# Patient Record
Sex: Female | Born: 1965
Health system: Southern US, Community
[De-identification: ages and names within clinical notes are randomized; demographics above are authoritative.]

## PROBLEM LIST (undated history)

## (undated) DIAGNOSIS — G4730 Sleep apnea, unspecified: Secondary | ICD-10-CM

## (undated) DIAGNOSIS — I10 Essential (primary) hypertension: Secondary | ICD-10-CM

## (undated) DIAGNOSIS — G4733 Obstructive sleep apnea (adult) (pediatric): Secondary | ICD-10-CM

## (undated) HISTORY — PX: ABDOMINAL HYSTERECTOMY: SHX81

## (undated) HISTORY — DX: Essential (primary) hypertension: I10

---

## 1999-04-20 ENCOUNTER — Other Ambulatory Visit: Admission: RE | Admit: 1999-04-20 | Discharge: 1999-04-20 | Payer: Self-pay | Admitting: *Deleted

## 1999-05-04 ENCOUNTER — Encounter: Payer: Self-pay | Admitting: Cardiology

## 1999-05-04 ENCOUNTER — Ambulatory Visit (HOSPITAL_COMMUNITY): Admission: RE | Admit: 1999-05-04 | Discharge: 1999-05-04 | Payer: Self-pay | Admitting: Cardiology

## 1999-05-12 ENCOUNTER — Encounter: Payer: Self-pay | Admitting: Cardiology

## 1999-05-12 ENCOUNTER — Encounter: Admission: RE | Admit: 1999-05-12 | Discharge: 1999-05-12 | Payer: Self-pay | Admitting: Cardiology

## 1999-06-02 ENCOUNTER — Encounter (INDEPENDENT_AMBULATORY_CARE_PROVIDER_SITE_OTHER): Payer: Self-pay | Admitting: *Deleted

## 1999-06-02 ENCOUNTER — Encounter (HOSPITAL_BASED_OUTPATIENT_CLINIC_OR_DEPARTMENT_OTHER): Payer: Self-pay | Admitting: General Surgery

## 1999-06-02 ENCOUNTER — Ambulatory Visit (HOSPITAL_BASED_OUTPATIENT_CLINIC_OR_DEPARTMENT_OTHER): Admission: RE | Admit: 1999-06-02 | Discharge: 1999-06-02 | Payer: Self-pay | Admitting: General Surgery

## 2000-04-03 ENCOUNTER — Other Ambulatory Visit: Admission: RE | Admit: 2000-04-03 | Discharge: 2000-04-03 | Payer: Self-pay | Admitting: *Deleted

## 2001-10-17 ENCOUNTER — Ambulatory Visit (HOSPITAL_COMMUNITY): Admission: RE | Admit: 2001-10-17 | Discharge: 2001-10-17 | Payer: Self-pay | Admitting: Internal Medicine

## 2001-10-17 ENCOUNTER — Encounter: Payer: Self-pay | Admitting: Internal Medicine

## 2001-10-26 ENCOUNTER — Encounter: Payer: Self-pay | Admitting: Internal Medicine

## 2001-10-26 ENCOUNTER — Ambulatory Visit (HOSPITAL_COMMUNITY): Admission: RE | Admit: 2001-10-26 | Discharge: 2001-10-26 | Payer: Self-pay | Admitting: Internal Medicine

## 2002-12-14 ENCOUNTER — Emergency Department (HOSPITAL_COMMUNITY): Admission: EM | Admit: 2002-12-14 | Discharge: 2002-12-14 | Payer: Self-pay | Admitting: Emergency Medicine

## 2002-12-26 ENCOUNTER — Emergency Department (HOSPITAL_COMMUNITY): Admission: EM | Admit: 2002-12-26 | Discharge: 2002-12-26 | Payer: Self-pay | Admitting: Emergency Medicine

## 2003-07-04 ENCOUNTER — Encounter: Admission: RE | Admit: 2003-07-04 | Discharge: 2003-07-04 | Payer: Self-pay | Admitting: Cardiology

## 2003-09-10 ENCOUNTER — Other Ambulatory Visit: Admission: RE | Admit: 2003-09-10 | Discharge: 2003-09-10 | Payer: Self-pay | Admitting: Obstetrics and Gynecology

## 2004-03-24 ENCOUNTER — Ambulatory Visit (HOSPITAL_COMMUNITY): Admission: RE | Admit: 2004-03-24 | Discharge: 2004-03-24 | Payer: Self-pay | Admitting: Obstetrics and Gynecology

## 2005-01-17 ENCOUNTER — Other Ambulatory Visit: Admission: RE | Admit: 2005-01-17 | Discharge: 2005-01-17 | Payer: Self-pay | Admitting: Obstetrics and Gynecology

## 2005-03-25 ENCOUNTER — Ambulatory Visit (HOSPITAL_COMMUNITY): Admission: RE | Admit: 2005-03-25 | Discharge: 2005-03-25 | Payer: Self-pay | Admitting: Cardiology

## 2005-03-29 ENCOUNTER — Encounter (INDEPENDENT_AMBULATORY_CARE_PROVIDER_SITE_OTHER): Payer: Self-pay | Admitting: *Deleted

## 2005-03-29 ENCOUNTER — Inpatient Hospital Stay (HOSPITAL_COMMUNITY): Admission: RE | Admit: 2005-03-29 | Discharge: 2005-03-31 | Payer: Self-pay | Admitting: Obstetrics and Gynecology

## 2005-06-08 ENCOUNTER — Emergency Department (HOSPITAL_COMMUNITY): Admission: EM | Admit: 2005-06-08 | Discharge: 2005-06-08 | Payer: Self-pay | Admitting: Family Medicine

## 2006-03-31 ENCOUNTER — Ambulatory Visit (HOSPITAL_COMMUNITY): Admission: RE | Admit: 2006-03-31 | Discharge: 2006-03-31 | Payer: Self-pay | Admitting: Cardiology

## 2006-04-08 ENCOUNTER — Emergency Department (HOSPITAL_COMMUNITY): Admission: EM | Admit: 2006-04-08 | Discharge: 2006-04-08 | Payer: Self-pay | Admitting: Emergency Medicine

## 2006-06-09 ENCOUNTER — Encounter: Admission: RE | Admit: 2006-06-09 | Discharge: 2006-06-09 | Payer: Self-pay | Admitting: Cardiology

## 2006-06-16 ENCOUNTER — Encounter: Admission: RE | Admit: 2006-06-16 | Discharge: 2006-06-16 | Payer: Self-pay | Admitting: Cardiology

## 2007-04-06 ENCOUNTER — Ambulatory Visit (HOSPITAL_COMMUNITY): Admission: RE | Admit: 2007-04-06 | Discharge: 2007-04-06 | Payer: Self-pay | Admitting: Obstetrics and Gynecology

## 2008-04-08 ENCOUNTER — Ambulatory Visit (HOSPITAL_COMMUNITY): Admission: RE | Admit: 2008-04-08 | Discharge: 2008-04-08 | Payer: Self-pay | Admitting: Obstetrics and Gynecology

## 2009-04-10 ENCOUNTER — Ambulatory Visit (HOSPITAL_COMMUNITY): Admission: RE | Admit: 2009-04-10 | Discharge: 2009-04-10 | Payer: Self-pay | Admitting: Obstetrics and Gynecology

## 2010-02-21 ENCOUNTER — Encounter: Payer: Self-pay | Admitting: Cardiology

## 2010-06-18 NOTE — Op Note (Signed)
Makayla Aguilar, Makayla Aguilar               ACCOUNT NO.:  [id number]   MEDICAL RECORD NO.:  [id number]          PATIENT TYPE:  INP   LOCATION:  9317                          FACILITY:  [facility]   PHYSICIAN:  Hal Morales, M.D.DATE OF BIRTH:  05/17/1965   DATE OF PROCEDURE:  03/29/2005  DATE OF DISCHARGE:                                 OPERATIVE REPORT   PREOPERATIVE DIAGNOSIS:  Symptomatic uterine fibroids, menorrhagia,  intermenstrual bleeding and dysmenorrhea.   POSTOPERATIVE DIAGNOSES:  1.  Symptomatic uterine fibroids, menorrhagia, intermenstrual bleeding and      dysmenorrhea.  2.  Pelvic adhesions.  3.  Probable endometriosis.  4.  Left ovarian hemorrhagic cyst.   PROCEDURE:  Total abdominal hysterectomy, bilateral salpingo-oophorectomy  and lysis of adhesions, Jackson-Pratt drain placement.   SURGEON:  Hal Morales, M.D.   FIRST ASSISTANT:  Elmira J. Powell, P.A.-C.   ANESTHESIA:  General orotracheal.   ESTIMATED BLOOD LOSS:  350 mL.   COMPLICATIONS:  None.   FINDINGS:  The uterus was enlarged to approximately 16-weeks' size with  multiple uterine fibroids. The weight of the uterus and cervix was 758  grams.  There were powder burn lesions on the left anterior fundus of the  uterus consistent with endometriosis.  The right tube and ovary appeared  within normal limits.  The left tube appeared within normal limits.  The  left ovary was enlarged by a 5 cm hemorrhagic cyst.   DESCRIPTION OF PROCEDURE:  The patient was taken to the operating room after  appropriate identification and placed on the operating table.  After the  attainment of adequate general anesthesia, the abdomen, perineum and vagina  were prepped with multiple layers of Betadine and a Foley catheter inserted  into the bladder and then connected to straight drainage.  The abdomen was  draped in a sterile field.  The suprapubic region was infiltrated with 18 cc  of 0.25% Marcaine and a suprapubic  incision made.  This was extended a bit  further to allow for adequate visualization.  The abdomen was opened in  layers and the muscle slightly relaxed to allow for adequate visualization.  The upper abdomen was evaluated and no further lesions noted.  The self-  retaining O'Connor-O'Sullivan retractor was then placed and the bladder  blade placed.  The uterus was elevated into the operative field with two  Kelly clamps.  The right round ligament was then suture ligated and incised  and that incision taken anteriorly on the anterior leaf of the broad  ligament.  The utero-ovarian ligament was then clamped, cut, suture ligated  and tied.  A similar procedure was carried out on the opposite side.  The  bladder was bluntly dissected off the anterior cervix.  The right uterine  artery was skeletonized, clamped, cut and suture ligated. The left artery  was likewise treated.  The uterine fundus was then excised from the cervix  and removed from the operative field.  The paracervical tissues were then  clamped, cut, cut and suture ligated.  The uterosacral ligaments on the  right and left side were clamped,  cut, suture ligated and those sutures  held.  The vaginal angles were then clamped, cut and suture ligated allowing  the cervix to be removed from the upper vagina and removed from the  operative field.  The vaginal cuff was further closed with figure-of-eight  suture of 0 Vicryl.  Copious irrigation was carried out and hemostasis noted  to be adequate.  The sutures holding the vaginal angles and uterosacral  ligaments were tied together.  The left tube and ovary were then elevated  into the operative field and the infundibulopelvic ligament skeletonized.  The ureter was identified and the infundibulopelvic ligament clamped, cut,  tied with a free tie and then suture ligated.  A similar procedure was  carried out on the opposite side and the right and left tubes and ovaries  were removed from  the operative field.  Copious irrigation was carried out  and hemostasis noted to be adequate. All instruments were removed from the  peritoneal cavity.  The abdominal peritoneum was closed with running suture  of 2-0 Vicryl.  The rectus fascia was closed with running sutures of 0  Vicryl from the apex to the midline and tied in the midline.  The  subcutaneous tissue was made hemostatic with the Bovie cautery.  A  subcutaneous Jackson-Pratt drain was then placed through a stab wound in the  left lower quadrant and tied in with a suture of 0 silk.  The skin incision  was closed with skin staples. The grenade was attached to the JP drain, and  a sterile dressing applied to the incision.  The patient was then awakened  from general anesthesia and taken to the recovery room in satisfactory  condition having tolerated the procedure well with sponge and instrument  counts correct.   SPECIMENS TO PATHOLOGY:  Uterus and cervix, bilateral tubes and ovaries.      Hal Morales, M.D.  Electronically Signed     VPH/MEDQ  D:  03/29/2005  T:  03/29/2005  Job:  [id number]

## 2010-06-18 NOTE — H&P (Signed)
[name], [name] NO.:  [id number]   MEDICAL RECORD NO.:  [id number]           PATIENT TYPE:   LOCATION:                                 FACILITY:   PHYSICIAN:  Hal Morales, M.D.DATE OF BIRTH:  February 23, 1965   DATE OF ADMISSION:  DATE OF DISCHARGE:                                HISTORY & PHYSICAL   HISTORY OF PRESENT ILLNESS:  [name] is a 45 year old single African-  American female para 1-0-1-1 who presents for a total abdominal hysterectomy  with bilateral salpingo-oophorectomy because of symptomatic uterine  fibroids. For the past 2 years the patient has experienced menorrhagia which  has been characterized by the use of a tampon plus a pad on a hourly basis  for her 5-day menstrual flow. In spite of this frequent change of  protection, the patient has on occasion soiled her clothes. Additionally,  the patient complains of severe dysmenorrhea which she rates as a 10/10 on a  10-point pain scale and is not relieved from various prescription-strength  nonsteroidal antiinflammatory medications. A pelvic ultrasound in January  2006 revealed a uterus measuring 19.5 x 8.8 x 9.9 cm in which there were  observed multiple fibroids; however, the largest of which was not able to be  measured due to shadowing. The remaining two measurable fibroids measured  3.8 x 2.8 cm and 3.4 x 2.5 cm. Ovaries appeared within normal limits;  however, were difficult to visualize. The patient had a TSH in September  2006 which was within normal limits and a negative HIV, gonorrhea, and  chlamydia culture. An endometrial biopsy performed December 2006 revealed a  benign secretory endometrium without hyperplasia or malignancy. The patient  was placed on Provera 40 mg daily for her menorrhagia which did curtail her  bleeding; however, she continued to experience prolonged spotting which  lasted approximately 14 days. Due to the lack of response the patient has  experienced from  medicinal preparations and the disruptive nature of her  symptoms, the patient has decided to proceed with definitive therapy in the  form of hysterectomy. Additionally, the patient has requested to also have  her ovaries removed.   PAST MEDICAL HISTORY:  OB history:  Gravida 2, para 1-0-1-1. The patient had  a spontaneous vaginal delivery in 1990.   GYN history:  Menarche 44 years old. Last menstrual period March 14, 2005. The patient uses abstinence as her method of contraception. She does  have a history of human papilloma virus and herpes simplex virus II. The  patient underwent cryosurgery for abnormal Pap smear in 1986. Remaining Pap  smears have been within normal limits, with her most recent being December  2006. The patient had a normal mammogram February 2006.   Medical history:  Positive for hypertension, anemia, and migraines.   Surgical history:  In 2001, right breast biopsy which was benign. Greater  than 20 years ago, a D&C. She denies any problems with anesthesia or history  of blood transfusion.   FAMILY HISTORY:  Positive for depression, diabetes, hypertension, cancer  (throat and stomach), cardiovascular disease, and stroke.  HABITS:  The patient does smoke one-half to three-quarters packs of  cigarettes per day. Denies any use of alcohol.   SOCIAL HISTORY:  The patient is single and she is currently unemployed.   CURRENT MEDICATIONS:  1.  Diovan HCT 80/12.5 mg daily.  2.  Multivitamin one tablet daily.  3.  Calcium 500 mg twice daily.   ALLERGIES:  The patient denies any drug allergies; however, states that  HYDROCODONE causes her to feel high.   REVIEW OF SYSTEMS:  The patient does wear reading glasses. She has had for  the past 8 days a productive cough with yellow sputum but denies any sore  throat, fever, chills, urinary tract symptoms, vaginitis symptoms, or flank  pain.   PHYSICAL EXAMINATION:  VITAL SIGNS:  Blood pressure 118/80, weight is  217  pounds, height 5 feet 5 inches tall, temperature 97.8 degrees Fahrenheit  orally.  NECK:  Supple. There are no masses, thyromegaly, or cervical adenopathy.  HEART:  Regular rate and rhythm. There is no murmur.  LUNGS:  Revealed rhonchi in the left lung field without wheezes or rales.  BACK:  No CVA tenderness.  ABDOMEN:  Bowel sounds are present. It is soft without tenderness, guarding,  rebound, or organomegaly.  EXTREMITIES:  Without clubbing, cyanosis, or edema.  PELVIC:  (From February 28, 2005) EG/BUS is within normal limits. The vagina  is rugous. Cervix is nontender without lesions. Uterus appears 14-week size  without tenderness. Adnexa without tenderness or masses.   IMPRESSION:  1.  Symptomatic uterine fibroids.  2.  Menorrhagia.  3.  Intermenstrual bleeding.  4.  Dysmenorrhea.  5.  Bronchitis.   DISPOSITION:  A discussion was held with the patient regarding the  indications for her procedure along with its risks which include but are not  limited to reaction to anesthesia, damage to adjacent organs, infection,  excessive bleeding, the fact that her pain may not be resolved by this  procedure, and that removal of her ovaries will cause an immediate state of  menopause. The patient has accepted these risks and has consented to proceed  with a total abdominal hysterectomy with bilateral salpingo-oophorectomy at  [facility] March 29, 2005, at 7:30 a.m. Additionally,  the patient was treated for her bronchitis with a Z-Pak which she was to  take as directed.      Elmira J. Adline Aguilar.      Hal Morales, M.D.  Electronically Signed    EJP/MEDQ  D:  03/23/2005  T:  03/23/2005  Job:  [id number]

## 2010-06-18 NOTE — Op Note (Signed)
[facility]  Patient:    TORIAN, THOENNES                        MRN: [id number] Proc. Date: 06/02/99 Adm. Date:  [id number] Disc. Date: [id number] Attending:  Fortino Sic                           Operative Report  PREOPERATIVE DIAGNOSIS:  Mass, right breast.  POSTOPERATIVE DIAGNOSIS:  Mass, right breast.  OPERATION PERFORMED:  Excision of right breast mass with needle localization.  SURGEON:  Marnee Spring. Wiliam Ke, M.D.  ASSISTANT:  None.  ANESTHESIA:  General by hospital.  DESCRIPTION OF PROCEDURE:  Under good general anesthesia, skin of the breast was prepped and draped in the usual manner.  An excision of skin along with the needle was made and a curvilinear incision was made medial and lateral.  This was deepened into subcutaneous tissue.  The needle was followed down until a mass was found;  this was a fairly clear-cut fibroadenoma, at least clinically.  It was removed along with a piece of normal breast tissue.  The wire was pulled out. Hemostasis was obtained with electrocautery current.  The wound was then closed with subcutaneous 3-0 Vicryl and subcuticular 4-0 Dexon.  Steri-Strips were applied.  Estimated blood loss minimal.  The patient received no blood and left the operating room in satisfactory condition, after sponge and needle counts were verified. DD:  06/02/99 TD:  06/04/99 Job: [id number]

## 2010-06-18 NOTE — Discharge Summary (Signed)
NAMEJAPJI, KOK               ACCOUNT NO.:  [id number]   MEDICAL RECORD NO.:  [id number]          PATIENT TYPE:  INP   LOCATION:  9317                          FACILITY:  [facility]   PHYSICIAN:  Hal Morales, M.D.DATE OF BIRTH:  [birth year]   DATE OF ADMISSION:  03/29/2005  DATE OF DISCHARGE:  03/31/2005                                 DISCHARGE SUMMARY   DISCHARGE DIAGNOSES:  1.  Symptomatic uterine fibroids.  2.  Menorrhagia.  3.  Intermenstrual bleeding.  4.  Adenomyosis.  5.  Dysmenorrhea.  6.  Endometrial polyp.  7.  Endometriosis.   OPERATION:  On the date of admission, the patient underwent a total  abdominal hysterectomy with bilateral salpingo-oophorectomy and lysis of  adhesions, along with placement of a JP drain, tolerating procedures well.  The patient was found to have a uterus which was enlarged to approximately  16 weeks' size with multiple fibroids.  The weight of the uterus and cervix  was 758 grams.  There were powder burn lesions on the left anterior fundus  of the uterus consistent with endometriosis.  The right tube and ovary  appeared within normal limits.  The left tube appeared within normal limits.  The left ovary was enlarged by a 5-cm hemorrhagic cyst.   HISTORY OF PRESENT ILLNESS:  Ms. Worden is a 45 year old, single, African-  American female, para 1-0-1-1 who presents for a total abdominal  hysterectomy with bilateral salpingo-oophorectomy because of symptomatic  uterine fibroids.  Please see the patient's dictated history and physical  examination for details.   PREOPERATIVE PHYSICAL EXAMINATION:  VITAL SIGNS:  Blood pressure 118/80,  weight 217 pounds, height 5 feet 5 inches tall, temperature 97.8 degrees  Fahrenheit orally.  GENERAL:  Within normal limits.  PELVIC:  EG/BUS was within normal limits.  Vagina was rugose.  Cervix was  nontender without lesions.  Uterus appeared 14 weeks' size without  tenderness.  Adnexa without tenderness or  masses.   HOSPITAL COURSE:  On the day of admission, the patient underwent  aforementioned procedures, tolerating them all well.  Postoperative course  was unremarkable with the patient tolerating a postop hemoglobin of 9.4  (preoperative hemoglobin 11.6).  By postop day #2, the patient had resumed  bowel and bladder function and was therefore deemed ready for discharge  home.   DISCHARGE MEDICATIONS:  1.  Iron 1 tablet twice daily for 6 weeks.  2.  Phenergan 25 mg every 6 hours as needed for nausea.  3.  Colace 100 milligram twice daily until bowel movements are regular.  4.  Ibuprofen 600 mg with food every 6 hours for 3 days, then as needed for      pain.  5.  Percocet 1-2 tablets every 4 hours as needed for pain.   FOLLOW UP:  1.  The patient is to call [facility] for an appointment on      April 05, 2005 to have her staples removed.  2.  She is otherwise scheduled to have her 6 weeks postoperative visit with      Dr. Pennie Rushing on May 17, 2005 at 11:30 a.m.   DISCHARGE INSTRUCTIONS:  1.  The patient was given a copy of [facility] postoperative      instruction sheet.  2.  She was further advised to avoid driving for 2 weeks, heavy lifting for      4 weeks, intercourse for 6 weeks, that she may walk up steps, may      shower, and was to increase her activity slowly.  3.  The patient's diet was without restriction.   FINAL PATHOLOGY:  Uterus, cervix, and right/left ovaries, and fallopian  tubes:  Cervix showed chronic cervicitis with squamous metaplasia.  No  intraepithelial lesion.  Endometrium was secretory with benign endometrial  polyp measuring 2-cm; the myometrium revealed adenomyosis with multiple  leiomyomas.  The patient's uterine serosa revealed endometriosis.  Ovaries  possessed hemorrhagic corpus luteum follicle cysts, surface adhesions, and  fallopian tubes were benign.      [name].      Hal Morales, M.D.   Electronically Signed    EJP/MEDQ  D:  04/21/2005  T:  04/22/2005  Job:  [id number]

## 2013-08-30 ENCOUNTER — Other Ambulatory Visit (HOSPITAL_COMMUNITY): Payer: Self-pay | Admitting: Internal Medicine

## 2013-08-30 DIAGNOSIS — Z1231 Encounter for screening mammogram for malignant neoplasm of breast: Secondary | ICD-10-CM

## 2013-09-13 ENCOUNTER — Ambulatory Visit (HOSPITAL_COMMUNITY)
Admission: RE | Admit: 2013-09-13 | Discharge: 2013-09-13 | Disposition: A | Discharge status: Home / Self Care | Payer: 59 | Source: Ambulatory Visit | Attending: Internal Medicine | Admitting: Internal Medicine

## 2013-09-13 DIAGNOSIS — Z1231 Encounter for screening mammogram for malignant neoplasm of breast: Secondary | ICD-10-CM | POA: Insufficient documentation

## 2014-09-01 LAB — HM COLONOSCOPY

## 2015-05-21 ENCOUNTER — Other Ambulatory Visit: Payer: Self-pay | Admitting: Internal Medicine

## 2015-05-21 DIAGNOSIS — N939 Abnormal uterine and vaginal bleeding, unspecified: Secondary | ICD-10-CM

## 2015-05-27 ENCOUNTER — Ambulatory Visit
Admission: RE | Admit: 2015-05-27 | Discharge: 2015-05-27 | Disposition: A | Discharge status: Home / Self Care | Payer: Medicaid Other | Source: Ambulatory Visit | Attending: Internal Medicine | Admitting: Internal Medicine

## 2015-05-27 DIAGNOSIS — N939 Abnormal uterine and vaginal bleeding, unspecified: Secondary | ICD-10-CM

## 2018-02-17 ENCOUNTER — Emergency Department (HOSPITAL_COMMUNITY): Payer: BLUE CROSS/BLUE SHIELD

## 2018-02-17 ENCOUNTER — Emergency Department (HOSPITAL_COMMUNITY)
Admission: EM | Admit: 2018-02-17 | Discharge: 2018-02-17 | Disposition: A | Discharge status: Home / Self Care | Payer: BLUE CROSS/BLUE SHIELD | Attending: Emergency Medicine | Admitting: Emergency Medicine

## 2018-02-17 ENCOUNTER — Encounter (HOSPITAL_COMMUNITY): Payer: Self-pay | Admitting: Emergency Medicine

## 2018-02-17 ENCOUNTER — Other Ambulatory Visit: Payer: Self-pay

## 2018-02-17 DIAGNOSIS — E079 Disorder of thyroid, unspecified: Secondary | ICD-10-CM | POA: Diagnosis not present

## 2018-02-17 DIAGNOSIS — F17210 Nicotine dependence, cigarettes, uncomplicated: Secondary | ICD-10-CM | POA: Insufficient documentation

## 2018-02-17 DIAGNOSIS — R0789 Other chest pain: Secondary | ICD-10-CM | POA: Diagnosis present

## 2018-02-17 DIAGNOSIS — R110 Nausea: Secondary | ICD-10-CM | POA: Insufficient documentation

## 2018-02-17 DIAGNOSIS — R030 Elevated blood-pressure reading, without diagnosis of hypertension: Secondary | ICD-10-CM | POA: Diagnosis not present

## 2018-02-17 LAB — BASIC METABOLIC PANEL
Anion gap: 10 (ref 5–15)
BUN: 13 mg/dL (ref 6–20)
CO2: 25 mmol/L (ref 22–32)
Calcium: 9.1 mg/dL (ref 8.9–10.3)
Chloride: 106 mmol/L (ref 98–111)
Creatinine, Ser: 0.94 mg/dL (ref 0.44–1.00)
GFR calc Af Amer: >60 mL/min (ref >60)
GFR calc non Af Amer: >60 mL/min (ref >60)
Glucose, Bld: 102 mg/dL — ABNORMAL HIGH (ref 70–99)
Potassium: 3.3 mmol/L — ABNORMAL LOW (ref 3.5–5.1)
Sodium: 141 mmol/L (ref 135–145)

## 2018-02-17 LAB — CBC
HCT: 39.4 % (ref 36.0–46.0)
Hemoglobin: 12.8 g/dL (ref 12.0–15.0)
MCH: 29.6 pg (ref 26.0–34.0)
MCHC: 32.5 g/dL (ref 30.0–36.0)
MCV: 91.2 fL (ref 80.0–100.0)
Platelets: 316 10*3/uL (ref 150–400)
RBC: 4.32 MIL/uL (ref 3.87–5.11)
RDW: 12.6 % (ref 11.5–15.5)
WBC: 8.3 10*3/uL (ref 4.0–10.5)
nRBC: 0 % (ref 0.0–0.2)

## 2018-02-17 LAB — D-DIMER, QUANTITATIVE: D-Dimer, Quant: 0.6 ug/mL-FEU — ABNORMAL HIGH (ref 0.00–0.50)

## 2018-02-17 LAB — I-STAT TROPONIN, ED: Troponin i, poc: 0.01 ng/mL (ref 0.00–0.08)

## 2018-02-17 LAB — TROPONIN I: Troponin I: <0.03 ng/mL (ref <0.03)

## 2018-02-17 MED ORDER — LORAZEPAM 2 MG/ML IJ SOLN
2.0000 mg | Freq: Once | INTRAMUSCULAR | Status: DC
  Filled 2018-02-17: qty 1

## 2018-02-17 MED ORDER — IOPAMIDOL (ISOVUE-370) INJECTION 76%
INTRAVENOUS | Status: AC
  Administered 2018-02-17: 75 mL
  Filled 2018-02-17: qty 100

## 2018-02-17 MED ORDER — MORPHINE SULFATE (PF) 4 MG/ML IV SOLN
4.0000 mg | Freq: Once | INTRAVENOUS | Status: AC
  Administered 2018-02-17: 4 mg via INTRAVENOUS
  Filled 2018-02-17: qty 1

## 2018-02-17 MED ORDER — METHOCARBAMOL 500 MG PO TABS: 500.0000 mg | ORAL_TABLET | Freq: Two times a day (BID) | ORAL | 0 refills | Status: DC

## 2018-02-17 MED ORDER — KETOROLAC TROMETHAMINE 15 MG/ML IJ SOLN
15.0000 mg | Freq: Once | INTRAMUSCULAR | Status: AC
  Administered 2018-02-17: 15 mg via INTRAMUSCULAR
  Filled 2018-02-17: qty 1

## 2018-02-17 MED ORDER — LORAZEPAM 2 MG/ML IJ SOLN: 0.5000 mg | Freq: Once | INTRAMUSCULAR | Status: DC

## 2018-02-17 MED ORDER — ONDANSETRON HCL 4 MG/2ML IJ SOLN
4.0000 mg | Freq: Once | INTRAMUSCULAR | Status: AC
  Administered 2018-02-17: 4 mg via INTRAVENOUS
  Filled 2018-02-17: qty 2

## 2018-02-17 NOTE — ED Triage Notes (Signed)
Patient c/o mid sternal chest pain x 3 days. Patient states for past two days pain has been intermittent, but pain became constant last night. No radiation. Mild shortness of breath with exertion.

## 2018-02-17 NOTE — ED Provider Notes (Signed)
[facility] EMERGENCY DEPARTMENT Provider Note   CSN: [id number] Arrival date & time: 02/17/18  2423     History   Chief Complaint Chief Complaint  Patient presents with  . Chest Pain    HPI Makayla Aguilar is a 53 y.o. female without history of chronic medical conditions or daily medication use presenting today for chest pain.  Patient states that she first noticed her chest pain 3 days ago while at work, right-sided "stuck feeling/pressure "that lasted for approximately 1 hour.  Pain was then intermittent for 2 days, coming and going at random times and always lasting for approximately 1 hour.  Patient states that her pain again started last night and has been constant since that time.  She describes her pain as a 10/10 in severity that is associated with nausea without vomiting.  Patient also endorses shortness of breath however only with exertion.  She states that her chest pain however does not get worse with exertion and that she does not have shortness of breath at rest.  Additionally patient reports that 3 days ago prior to onset of chest pain she was at work sitting in her chair. She states that she was feeling tired and does not remember falling asleep. 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  Patient does not believe this was true syncope and that she may have just fallen asleep. She has not had a recurrence since that time.  Patient states that she is an otherwise healthy 53 year old female without chronic medical conditions or daily medication use.  She denies any risk factors including diabetes, hypertension, hyperlipidemia, CAD, diabetes, smoking, family history of CAD.  Patient denies history of blood clot, recent immobilization, cough/hemoptysis, extremity swelling, history of malignancy.  HPI  History reviewed. No pertinent past medical history.  There are no active problems to display for this  patient.   Past Surgical History:  Procedure Laterality Date  . ABDOMINAL HYSTERECTOMY       OB History   No obstetric history on file.      Home Medications    Prior to Admission medications   Medication Sig Start Date End Date Taking? Authorizing Provider  methocarbamol (ROBAXIN) 500 MG tablet Take 1 tablet (500 mg total) by mouth 2 (two) times daily. 02/17/18   Bill Salinas, PA-C    Family History No family history on file.  Social History Social History   Tobacco Use  . Smoking status: Current Every Day Smoker    Packs/day: 0.50    Types: Cigarettes  . Smokeless tobacco: Never Used  Substance Use Topics  . Alcohol use: Not Currently  . Drug use: Not Currently     Allergies   Patient has no known allergies.   Review of Systems Review of Systems  Constitutional: Negative.  Negative for chills, diaphoresis and fever.  HENT: Negative for rhinorrhea.   Eyes: Negative.  Negative for visual disturbance.  Respiratory: Positive for shortness of breath (Only with exertion). Negative for cough.   Cardiovascular: Positive for chest pain.  Gastrointestinal: Positive for nausea. Negative for abdominal pain, diarrhea and vomiting.  Musculoskeletal: Negative.  Negative for arthralgias and myalgias.  Neurological: Positive for syncope (Questionable). Negative for dizziness, weakness and headaches.  All other systems reviewed and are negative.  Physical Exam Updated Vital Signs BP (!) 148/75   Pulse 66   Temp 98.5 F (36.9 C) (Oral)   Resp 12  Ht 5\' 6"  (1.676 m)   Wt 93.4 kg   SpO2 100%   BMI 33.25 kg/m   Physical Exam Constitutional:      General: She is not in acute distress.    Appearance: She is well-developed. She is not ill-appearing or diaphoretic.  HENT:     Head: Normocephalic and atraumatic.     Right Ear: External ear normal.     Left Ear: External ear normal.     Nose: Nose normal.  Eyes:     Pupils: Pupils are equal, round, and  reactive to light.  Neck:     Musculoskeletal: Normal range of motion and neck supple.     Trachea: Trachea normal. No tracheal deviation.  Cardiovascular:     Rate and Rhythm: Normal rate and regular rhythm.     Pulses:          Radial pulses are 2+ on the right side and 2+ on the left side.       Dorsalis pedis pulses are 2+ on the right side and 2+ on the left side.       Posterior tibial pulses are 2+ on the right side and 2+ on the left side.     Heart sounds: Normal heart sounds.  Pulmonary:     Effort: Pulmonary effort is normal. No respiratory distress.     Breath sounds: Normal breath sounds. No wheezing or rhonchi.  Chest:     Chest wall: No tenderness.  Abdominal:     Palpations: Abdomen is soft.     Tenderness: There is no abdominal tenderness. There is no guarding or rebound.  Musculoskeletal: Normal range of motion.     Right lower leg: She exhibits no tenderness. No edema.     Left lower leg: She exhibits no tenderness. No edema.  Feet:     Right foot:     Protective Sensation: 3 sites tested. 3 sites sensed.     Left foot:     Protective Sensation: 3 sites tested. 3 sites sensed.  Skin:    General: Skin is warm and dry.     Capillary Refill: Capillary refill takes less than 2 seconds.  Neurological:     General: No focal deficit present.     Mental Status: She is alert and oriented to person, place, and time.     GCS: GCS eye subscore is 4. GCS verbal subscore is 5. GCS motor subscore is 6.     Comments: Speech is clear and goal oriented, follows commands Major Cranial nerves without deficit, no facial droop Normal strength in upper and lower extremities bilaterally including dorsiflexion and plantar flexion, strong and equal grip strength Sensation normal to light touch Moves extremities without ataxia, coordination intact Normal gait  Psychiatric:        Mood and Affect: Mood normal.        Behavior: Behavior normal.    ED Treatments / Results   Labs (all labs ordered are listed, but only abnormal results are displayed) Labs Reviewed  BASIC METABOLIC PANEL - Abnormal; Notable for the following components:      Result Value   Potassium 3.3 (*)    Glucose, Bld 102 (*)    All other components within normal limits  D-DIMER, QUANTITATIVE (NOT AT [facility]) - Abnormal; Notable for the following components:   D-Dimer, Quant 0.60 (*)    All other components within normal limits  CBC  TROPONIN I  I-STAT TROPONIN, ED    EKG  EKG Interpretation  Date/Time:  Saturday February 17 2018 09:51:37 EST Ventricular Rate:  69 PR Interval:    QRS Duration: 96 QT Interval:  390 QTC Calculation: 418 R Axis:   49 Text Interpretation:  Sinus rhythm Abnormal R-wave progression, early transition Baseline wander in lead(s) II III aVF V3 V4 V5 No old tracing to compare Confirmed by Lorre NickAllen, Anthony ([id number]) on 02/17/2018 11:26:36 AM   Radiology Ct Angio Chest Pe W And/or Wo Contrast  Result Date: 02/17/2018 CLINICAL DATA:  Chest pain and positive D-dimer study EXAM: CT ANGIOGRAPHY CHEST WITH CONTRAST TECHNIQUE: Multidetector CT imaging of the chest was performed using the standard protocol during bolus administration of intravenous contrast. Multiplanar CT image reconstructions and MIPs were obtained to evaluate the vascular anatomy. CONTRAST:  75mL ISOVUE-370 IOPAMIDOL (ISOVUE-370) INJECTION 76% COMPARISON:  Chest radiograph February 17, 2018 FINDINGS: Cardiovascular: There is no demonstrable pulmonary embolus. There is no thoracic aortic aneurysm or dissection. Visualized great vessels appear normal. There is left ventricular hypertrophy. There is no pericardial effusion or pericardial thickening. Mediastinum/Nodes: There is a dominant mass in the right lobe of the thyroid measuring 2.8 x 1.5 cm. There are occasional subcentimeter mediastinal lymph nodes. There is no adenopathy by size criteria evident. No esophageal lesions are appreciable. Lungs/Pleura: There  is no edema or consolidation. No pleural effusion or pleural thickening. There is mild central peribronchial thickening on the left. Upper Abdomen: There is a 1.2 x 1.0 cm benign adenoma on the left. Visualized upper abdominal structures otherwise appear normal. Musculoskeletal: There are no blastic or lytic bone lesions. No chest wall lesions are evident. Review of the MIP images confirms the above findings. IMPRESSION: 1. No demonstrable pulmonary embolus. No thoracic aortic aneurysm or dissection. There is left ventricular hypertrophy. 2. Mild central bronchitis on the left. No lung edema or consolidation. 3.  No thoracic adenopathy. 4. **An incidental finding of potential clinical significance has been found. Dominant mass right lobe of the thyroid measuring 2.8 x 1.5 cm. Consider further evaluation with thyroid ultrasound nonemergently. If patient is clinically hyperthyroid, consider nuclear medicine thyroid uptake and scan.** 5.  Small benign left adrenal adenoma. Electronically Signed   By: Bretta BangWilliam  Woodruff III M.D.   On: 02/17/2018 12:42   Dg Chest Port 1 View  Result Date: 02/17/2018 CLINICAL DATA:  Chest pain, shortness of breath EXAM: PORTABLE CHEST 1 VIEW COMPARISON:  06/09/2006 FINDINGS: The heart size and mediastinal contours are within normal limits. Both lungs are clear. The visualized skeletal structures are unremarkable. IMPRESSION: No active disease. Electronically Signed   By: Elige KoHetal  Patel   On: 02/17/2018 10:09    Procedures Procedures (including critical care time)  Medications Ordered in ED Medications  LORazepam (ATIVAN) injection 0.5 mg (0.5 mg Intravenous Not Given 02/17/18 1429)  morphine 4 MG/ML injection 4 mg (4 mg Intravenous Given 02/17/18 1013)  ondansetron (ZOFRAN) injection 4 mg (4 mg Intravenous Given 02/17/18 1013)  iopamidol (ISOVUE-370) 76 % injection (75 mLs  Contrast Given 02/17/18 1221)  ketorolac (TORADOL) 15 MG/ML injection 15 mg (15 mg Intramuscular Given  02/17/18 1429)     Initial Impression / Assessment and Plan / ED Course  I have reviewed the triage vital signs and the nursing notes.  Pertinent labs & imaging results that were available during my care of the patient were reviewed by me and considered in my medical decision making (see chart for details).  Clinical Course as of Feb 17 1522  Sat Feb 17, 2018  1214  Discussed CT delay with radiology, patient is next in line to receive scan.   [BM]    Clinical Course User Index [BM] Elizabeth Palau   13:71 AM: 53 year old otherwise healthy female presents today for 3 days of intermittent chest pain which became continuous beginning last night.  Right-sided pressure like feeling.  Patient without shortness of breath at rest but states that she feels slightly short of breath with exertion.  Pain however is without exacerbating factors.  On arrival patient is well-appearing, no acute distress.  Patient noted to be hypertensive however all other vital signs within normal limits.  Pulse around 60 bpm with SPO2 of 100% on room air.  Pedal pulses intact and equal bilaterally.  Lab work, EKG, chest x-ray ordered. ---------------- EKG without acute findings reviewed by Dr. Freida Busman Initial troponin negative CBC within normal limits BMP nonacute Chest x-ray negative D-dimer pending Pain and nausea medication ordered --------------- Patient states provement of her symptoms. D-dimer is positive, CT angio ordered --------------- Patient reevaluated, resting comfortably no acute distress.  She is sleeping, easily arousable to voice.  States that she feels well at this time.  Vital signs stable. ------------- CT Angie of chest:  IMPRESSION:  1. No demonstrable pulmonary embolus. No thoracic aortic aneurysm or  dissection. There is left ventricular hypertrophy.    2. Mild central bronchitis on the left. No lung edema or  consolidation.    3. No thoracic adenopathy.    4. **An  incidental finding of potential clinical significance has  been found. Dominant mass right lobe of the thyroid measuring 2.8 x  1.5 cm. Consider further evaluation with thyroid ultrasound  nonemergently. If patient is clinically hyperthyroid, consider  nuclear medicine thyroid uptake and scan.**    5. Small benign left adrenal adenoma.  ---------------------------- Patient informed of incidental CT findings and need for follow-up. ------------ Delta Troponin negative ------------  The patient was noted to have elevated BP in ED today. I have spoken with the patient regarding elevated blood pressure readings and the need for improved management. I instructed the patient to followup with their PCP within 1 week for BP check. I also counseled the patient regarding the signs and symptoms which would require an emergent visit to an emergency department for hypertensive urgency and/or hypertensive emergency.  Discussed questionable syncope vs meerly falling asleep with Dr. Freida Busman, this has not recurred. Dr. Freida Busman agrees, do not suspect true syncope at this time. Patient informed to return if she has a syncopal episode in the future.  Patient is to be discharged with recommendation to follow up with PCP in regards to today's hospital visit. Chest pain is not likely of cardiac or pulmonary etiology d/t presentation, perc negative, VSS, no tracheal deviation, no JVD or new murmur, RRR, breath sounds equal bilaterally, EKG without acute abnormalities, negative troponin, and negative CXR. Pt has been advised to return to the ED is CP becomes exertional, associated with diaphoresis or nausea, radiates to left jaw/arm, worsens or becomes concerning in any way. Pt appears reliable for follow up and is agreeable to discharge.   Heart score less than 4: One point for age.  Patient concerned that pain may return in the future, return precautions discussed.  Suspect musculoskeletal etiology of resolved pain,  patient has been prescribed Robaxin. Patient informed not to drive or operate machinery if taking Robaxin.   At this time there does not appear to be any evidence of an acute emergency medical condition and the patient  appears stable for discharge with appropriate outpatient follow up. Diagnosis was discussed with patient who verbalizes understanding of care plan and is agreeable to discharge. I have discussed return precautions with patient who verbalizes understanding of return precautions. Patient strongly encouraged to follow-up with their PCP this week. All questions answered.  Case has been discussed with Dr. Freida Busman who agrees with the above plan to discharge  Note: Portions of this report may have been transcribed using voice recognition software. Every effort was made to ensure accuracy; however, inadvertent computerized transcription errors may still be present. Final Clinical Impressions(s) / ED Diagnoses   Final diagnoses:  Atypical chest pain  Thyroid mass  Elevated blood pressure reading    ED Discharge Orders         Ordered    methocarbamol (ROBAXIN) 500 MG tablet  2 times daily     02/17/18 1524           Elizabeth Palau 02/17/18 1559    Lorre Nick, MD 02/18/18 [phone]

## 2018-02-17 NOTE — Discharge Instructions (Addendum)
You have been diagnosed today with atypical chest pain as well as an incidental finding of a thyroid mass and elevated blood pressure.  At this time there does not appear to be the presence of an emergent medical condition, however there is always the potential for conditions to change. Please read and follow the below instructions.  Please return to the Emergency Department immediately for any new or worsening symptoms. Please be sure to follow up with your Primary Care Provider this week regarding your visit today; please call their office to schedule an appointment even if you are feeling better for a follow-up visit. Incidentally a mass in the right lobe of your thyroid approximately 2.8 x 1.5 cm was found on CT scan today.  Please discuss this with your primary care provider this week.  It is highly recommended that further evaluation of your thyroid mass is performed including ultrasound.  Please schedule this with your primary care provider this week. Additionally your blood pressure was elevated today.  Please discuss this with your primary care provider this week and go to their office for blood pressure recheck.  Additionally your CT scan shows left ventricular hypertrophy, discuss this with your primary care provider this week. You may use the muscle relaxer Robaxin as prescribed for musculoskeletal pain.  Do not drive or operate machinery will take this medication because it may make you drowsy.  Get help right away if: Your chest pain gets worse. You have a cough that gets worse, or you cough up blood. You have severe pain in your abdomen. You faint. You have sudden, unexplained chest discomfort. You have sudden, unexplained discomfort in your arms, back, neck, or jaw. You have shortness of breath at any time. You suddenly start to sweat, or your skin gets clammy. You feel nausea or you vomit. You suddenly feel lightheaded or dizzy. You have severe weakness, or unexplained weakness  or fatigue. Your heart begins to beat quickly, or it feels like it is skipping beats. You have fever Your pain returns Get help right away if: You get a very bad headache. You start to feel confused. You feel weak or numb. You feel faint. You get very bad pain in your: Chest. Belly (abdomen). You throw up (vomit) more than once. You have trouble breathing.  Please read the additional information packets attached to your discharge summary.  Do not take your medicine if  develop an itchy rash, swelling in your mouth or lips, or difficulty breathing.

## 2018-02-19 ENCOUNTER — Encounter: Payer: Self-pay | Admitting: Nurse Practitioner

## 2018-02-19 ENCOUNTER — Ambulatory Visit: Payer: BLUE CROSS/BLUE SHIELD | Admitting: Nurse Practitioner

## 2018-02-19 ENCOUNTER — Other Ambulatory Visit: Payer: Self-pay

## 2018-02-19 VITALS — BP 162/100 | HR 70 | Temp 98.3°F | Ht 66.8 in | Wt 212.2 lb

## 2018-02-19 DIAGNOSIS — R221 Localized swelling, mass and lump, neck: Secondary | ICD-10-CM | POA: Diagnosis not present

## 2018-02-19 DIAGNOSIS — J3089 Other allergic rhinitis: Secondary | ICD-10-CM

## 2018-02-19 DIAGNOSIS — B349 Viral infection, unspecified: Secondary | ICD-10-CM | POA: Diagnosis not present

## 2018-02-19 DIAGNOSIS — R030 Elevated blood-pressure reading, without diagnosis of hypertension: Secondary | ICD-10-CM

## 2018-02-19 DIAGNOSIS — R631 Polydipsia: Secondary | ICD-10-CM | POA: Diagnosis not present

## 2018-02-19 LAB — POCT URINALYSIS DIP (MANUAL ENTRY)
Bilirubin, UA: NEGATIVE
Glucose, UA: NEGATIVE mg/dL
Ketones, POC UA: NEGATIVE mg/dL
Leukocytes, UA: NEGATIVE
Nitrite, UA: NEGATIVE
Spec Grav, UA: 1.025 (ref 1.010–1.025)
Urobilinogen, UA: 0.2 E.U./dL
pH, UA: 6.5 (ref 5.0–8.0)

## 2018-02-19 MED ORDER — MOMETASONE FUROATE 50 MCG/ACT NA SUSP: 2.0000 | Freq: Every day | NASAL | 2 refills | Status: DC

## 2018-02-19 MED ORDER — AZITHROMYCIN 250 MG PO TABS: ORAL_TABLET | ORAL | 0 refills | Status: DC

## 2018-02-19 NOTE — Patient Instructions (Addendum)
   Can take over the counter Nasonex or Flonase if insurance is does not cover medication

## 2018-02-19 NOTE — Progress Notes (Signed)
  Subjective:     Patient ID: Makayla Aguilar , female    DOB: 10/25/1965 , 53 y.o.   MRN: [id number]   Chief Complaint  Patient presents with  . cant smell  . right ear pain    all started last week   . Polydipsia    HPI  Otalgia   There is pain in the right ear. The current episode started 1 to 4 weeks ago. The problem occurs constantly. The problem has been gradually worsening. There has been no fever. The pain is mild. Associated symptoms include a sore throat. Pertinent negatives include no abdominal pain, coughing, ear discharge, hearing loss or vomiting. Associated symptoms comments: Difficulty smelling . She has tried nothing for the symptoms.     No past medical history on file.   No family history on file.   Current Outpatient Medications:  .  methocarbamol (ROBAXIN) 500 MG tablet, Take 1 tablet (500 mg total) by mouth 2 (two) times daily., Disp: 14 tablet, Rfl: 0   Allergies  Allergen Reactions  . Hydrocodone     Gets really high     Review of Systems  Constitutional: Positive for fatigue.  HENT: Positive for ear pain and sore throat. Negative for ear discharge and hearing loss.   Respiratory: Negative for cough and shortness of breath.   Cardiovascular: Negative for chest pain, palpitations and leg swelling.  Gastrointestinal: Negative for abdominal pain and vomiting.     Today's Vitals   02/19/18 1056  BP: (!) 160/98  Pulse: 70  Temp: 98.3 F (36.8 C)  TempSrc: Oral  SpO2: 97%  Weight: 212 lb 3.2 oz (96.3 kg)  Height: 5' 6.8" (1.697 m)   Body mass index is 33.43 kg/m.   Objective:  Physical Exam      Assessment And Plan:     1. Mass of thyroid region  Incidental finding for mass to right thyroid during CT scan of chest.  Will send for thyroid ultrasound - US Soft Tissue Head/Neck; Future - TSH - T3 - T4, Free  2. Viral infection  One week history of cold symptoms, fatigue associated and difficulty smelling - azithromycin (ZITHROMAX  Z-PAK) 250 MG tablet; Take 2 tablets (500 mg) on  Day 1,  followed by 1 tablet (250 mg) once daily on Days 2 through 5.  Dispense: 6 each; Refill: 0  3. Non-seasonal allergic rhinitis, unspecified trigger  Bilateral turbinates are hypertrophy  Encouraged to use flonase daily - mometasone (NASONEX) 50 MCG/ACT nasal spray; Place 2 sprays into the nose daily.  Dispense: 17 g; Refill: 2  4. Increased thirst  Will check HgbA1c may be associated to dehydration  Urinalysis negative for ketones - Hemoglobin A1c - POCT urinalysis dipstick  5. Elevated blood pressure reading without diagnosis of hypertension  She has not been feeling well will have her to return in 1 week for follow up blood pressure check   Encouraged to avoid NSAIDs      [name], FNP

## 2018-02-20 LAB — T3: T3, Total: 75 ng/dL (ref 71–180)

## 2018-02-20 LAB — TSH: TSH: 0.507 u[IU]/mL (ref 0.450–4.500)

## 2018-02-20 LAB — HEMOGLOBIN A1C
Est. average glucose Bld gHb Est-mCnc: 100 mg/dL
Hgb A1c MFr Bld: 5.1 % (ref 4.8–5.6)

## 2018-02-20 LAB — T4, FREE: Free T4: 1.19 ng/dL (ref 0.82–1.77)

## 2018-02-23 ENCOUNTER — Ambulatory Visit: Payer: BLUE CROSS/BLUE SHIELD | Admitting: Nurse Practitioner

## 2018-02-23 ENCOUNTER — Other Ambulatory Visit: Payer: Self-pay

## 2018-02-23 ENCOUNTER — Encounter: Payer: Self-pay | Admitting: Nurse Practitioner

## 2018-02-23 VITALS — BP 138/88 | HR 83 | Temp 98.0°F | Resp 16 | Wt 208.0 lb

## 2018-02-23 DIAGNOSIS — Z0000 Encounter for general adult medical examination without abnormal findings: Secondary | ICD-10-CM

## 2018-02-23 DIAGNOSIS — Z1231 Encounter for screening mammogram for malignant neoplasm of breast: Secondary | ICD-10-CM

## 2018-02-23 DIAGNOSIS — J3089 Other allergic rhinitis: Secondary | ICD-10-CM | POA: Diagnosis not present

## 2018-02-23 DIAGNOSIS — R030 Elevated blood-pressure reading, without diagnosis of hypertension: Secondary | ICD-10-CM

## 2018-02-23 NOTE — Progress Notes (Signed)
Subjective:     Patient ID: Makayla Aguilar , female    DOB: August 16, 1965 , 53 y.o.   MRN: [id number]   Chief Complaint  Patient presents with  . Annual Exam   The patient states she uses status post hysterectomy for birth control. Last LMP was No LMP recorded. Patient has had a hysterectomy.. Negative for Dysmenorrhea and Negative for Menorrhagia Mammogram last done 2015 Negative for: breast discharge, breast lump(s), breast pain and breast self exam.  Pertinent negatives include abnormal bleeding (hematology), anxiety, decreased libido, depression, difficulty falling sleep, dyspareunia, history of infertility, nocturia, sexual dysfunction, sleep disturbances, urinary incontinence, urinary urgency, vaginal discharge and vaginal itching. Diet regular.The patient states her exercise level is  5 times per week (swimming and treadmill)   The patient's tobacco use is:   Social History   Tobacco Use  Smoking Status Current Every Day Smoker  . Packs/day: 0.50  . Types: Cigarettes  Smokeless Tobacco Never Used   She has been exposed to passive smoke. The patient's alcohol use is:   Social History   Substance and Sexual Activity  Alcohol Use Not Currently   Additional information: Last pap 10 years (she is declining one today, denies having sex).   HPI   Here for HM     History reviewed. No pertinent past medical history.   History reviewed. No pertinent family history.  No current outpatient medications on file.   Allergies  Allergen Reactions  . Hydrocodone     Gets really high     Review of Systems  Constitutional: Negative.   HENT: Negative.   Eyes: Negative.   Respiratory: Negative.   Cardiovascular: Negative.   Gastrointestinal: Negative.   Endocrine: Negative.   Genitourinary: Negative.   Musculoskeletal: Negative.   Skin: Negative.   Allergic/Immunologic: Negative.   Neurological: Negative.   Hematological: Negative.   Psychiatric/Behavioral: Negative.       Today's Vitals   02/23/18 0911  BP: 138/88  Pulse: 83  Resp: 16  Temp: 98 F (36.7 C)  TempSrc: Oral  SpO2: 97%  Weight: 208 lb (94.3 kg)   Body mass index is 32.77 kg/m.   Objective:  Physical Exam Constitutional:      General: She is not in acute distress.    Appearance: Normal appearance. She is well-developed.  HENT:     Head: Normocephalic and atraumatic.     Right Ear: Hearing, tympanic membrane, ear canal and external ear normal.     Left Ear: Hearing, tympanic membrane, ear canal and external ear normal.     Nose: Nose normal.     Mouth/Throat:     Mouth: Mucous membranes are moist.  Eyes:     General: Lids are normal.     Conjunctiva/sclera: Conjunctivae normal.     Pupils: Pupils are equal, round, and reactive to light.     Funduscopic exam:    Right eye: No papilledema.        Left eye: No papilledema.  Neck:     Musculoskeletal: Full passive range of motion without pain, normal range of motion and neck supple.     Thyroid: No thyroid mass.     Vascular: No carotid bruit.  Cardiovascular:     Rate and Rhythm: Normal rate and regular rhythm.     Pulses: Normal pulses.     Heart sounds: Normal heart sounds. No murmur.  Pulmonary:     Effort: Pulmonary effort is normal.     Breath sounds: Normal  breath sounds.  Abdominal:     General: Bowel sounds are normal.     Palpations: Abdomen is soft.  Musculoskeletal: Normal range of motion.  Skin:    General: Skin is warm and dry.     Capillary Refill: Capillary refill takes less than 2 seconds.  Neurological:     Mental Status: She is alert and oriented to person, place, and time.     Cranial Nerves: No cranial nerve deficit.     Sensory: No sensory deficit.  Psychiatric:        Behavior: Behavior normal.        Thought Content: Thought content normal.        Judgment: Judgment normal.         Assessment And Plan:     1. Elevated blood pressure reading without diagnosis of hypertension . B/P is  better this visit and she is not on any medications for this, I have discussed with her in detail it may be a good idea to treat her with a low dose medication to avoid spikes, she is refusing to do so at the time. She relates her previous elevation of her blood pressure to pain.   . CMP ordered to check renal function.  . The importance of regular exercise and dietary modification was stressed to the patient.  . Stressed importance of losing ten percent of her body weight to help with B/P control.  . The weight loss would help with decreasing cardiac and cancer risk as well.  - Comprehensive metabolic panel  2. Health maintenance examination . Behavior modifications discussed and diet history reviewed.   . Pt will continue to exercise regularly and modify diet with low GI, plant based foods and decrease intake of processed foods.  . Recommend intake of daily multivitamin, Vitamin D, and calcium.  . Recommend mammogram for preventive screenings, as well as recommend immunizations that include influenza, TDAP, and Shingles - CBC with Differential/Platelet - Comprehensive metabolic panel - Lipid panel  3. Screening mammogram, encounter for  Pt instructed on Self Breast Exam.According to ACOG guidelines Women aged 26 and older are recommended to get an annual mammogram. Form completed and given to patient contact the [facility] for appointment scheduing.   Pt encouraged to get annual mammogram - MM DIGITAL SCREENING BILATERAL; Future  4. Non-seasonal allergic rhinitis, unspecified trigger  Encouraged to take over the counter antihistamine     [name], FNP

## 2018-02-23 NOTE — Patient Instructions (Addendum)
Take fluticasone daily as needed.   Health Maintenance, Female Adopting a healthy lifestyle and getting preventive care can go a long way to promote health and wellness. Talk with your health care provider about what schedule of regular examinations is right for you. This is a good chance for you to check in with your provider about disease prevention and staying healthy. In between checkups, there are plenty of things you can do on your own. Experts have done a lot of research about which lifestyle changes and preventive measures are most likely to keep you healthy. Ask your health care provider for more information. Weight and diet Eat a healthy diet  Be sure to include plenty of vegetables, fruits, low-fat dairy products, and lean protein.  Do not eat a lot of foods high in solid fats, added sugars, or salt.  Get regular exercise. This is one of the most important things you can do for your health. ? Most adults should exercise for at least 150 minutes each week. The exercise should increase your heart rate and make you sweat (moderate-intensity exercise). ? Most adults should also do strengthening exercises at least twice a week. This is in addition to the moderate-intensity exercise. Maintain a healthy weight  Body mass index (BMI) is a measurement that can be used to identify possible weight problems. It estimates body fat based on height and weight. Your health care provider can help determine your BMI and help you achieve or maintain a healthy weight.  For females 75 years of age and older: ? A BMI below 18.5 is considered underweight. ? A BMI of 18.5 to 24.9 is normal. ? A BMI of 25 to 29.9 is considered overweight. ? A BMI of 30 and above is considered obese. Watch levels of cholesterol and blood lipids  You should start having your blood tested for lipids and cholesterol at 54 years of age, then have this test every 5 years.  You may need to have your cholesterol levels  checked more often if: ? Your lipid or cholesterol levels are high. ? You are older than 54 years of age. ? You are at high risk for heart disease. Cancer screening Lung Cancer  Lung cancer screening is recommended for adults 73-95 years old who are at high risk for lung cancer because of a history of smoking.  A yearly low-dose CT scan of the lungs is recommended for people who: ? Currently smoke. ? Have quit within the past 15 years. ? Have at least a 30-pack-year history of smoking. A pack year is smoking an average of one pack of cigarettes a day for 1 year.  Yearly screening should continue until it has been 15 years since you quit.  Yearly screening should stop if you develop a health problem that would prevent you from having lung cancer treatment. Breast Cancer  Practice breast self-awareness. This means understanding how your breasts normally appear and feel.  It also means doing regular breast self-exams. Let your health care provider know about any changes, no matter how small.  If you are in your 20s or 30s, you should have a clinical breast exam (CBE) by a health care provider every 1-3 years as part of a regular health exam.  If you are 10 or older, have a CBE every year. Also consider having a breast X-ray (mammogram) every year.  If you have a family history of breast cancer, talk to your health care provider about genetic screening.  If you  are at high risk for breast cancer, talk to your health care provider about having an MRI and a mammogram every year.  Breast cancer gene (BRCA) assessment is recommended for women who have family members with BRCA-related cancers. BRCA-related cancers include: ? Breast. ? Ovarian. ? Tubal. ? Peritoneal cancers.  Results of the assessment will determine the need for genetic counseling and BRCA1 and BRCA2 testing. Cervical Cancer Your health care provider may recommend that you be screened regularly for cancer of the pelvic  organs (ovaries, uterus, and vagina). This screening involves a pelvic examination, including checking for microscopic changes to the surface of your cervix (Pap test). You may be encouraged to have this screening done every 3 years, beginning at age 27.  For women ages 59-65, health care providers may recommend pelvic exams and Pap testing every 3 years, or they may recommend the Pap and pelvic exam, combined with testing for human papilloma virus (HPV), every 5 years. Some types of HPV increase your risk of cervical cancer. Testing for HPV may also be done on women of any age with unclear Pap test results.  Other health care providers may not recommend any screening for nonpregnant women who are considered low risk for pelvic cancer and who do not have symptoms. Ask your health care provider if a screening pelvic exam is right for you.  If you have had past treatment for cervical cancer or a condition that could lead to cancer, you need Pap tests and screening for cancer for at least 20 years after your treatment. If Pap tests have been discontinued, your risk factors (such as having a new sexual partner) need to be reassessed to determine if screening should resume. Some women have medical problems that increase the chance of getting cervical cancer. In these cases, your health care provider may recommend more frequent screening and Pap tests. Colorectal Cancer  This type of cancer can be detected and often prevented.  Routine colorectal cancer screening usually begins at 54 years of age and continues through 54 years of age.  Your health care provider may recommend screening at an earlier age if you have risk factors for colon cancer.  Your health care provider may also recommend using home test kits to check for hidden blood in the stool.  A small camera at the end of a tube can be used to examine your colon directly (sigmoidoscopy or colonoscopy). This is done to check for the earliest forms  of colorectal cancer.  Routine screening usually begins at age 83.  Direct examination of the colon should be repeated every 5-10 years through 54 years of age. However, you may need to be screened more often if early forms of precancerous polyps or small growths are found. Skin Cancer  Check your skin from head to toe regularly.  Tell your health care provider about any new moles or changes in moles, especially if there is a change in a mole's shape or color.  Also tell your health care provider if you have a mole that is larger than the size of a pencil eraser.  Always use sunscreen. Apply sunscreen liberally and repeatedly throughout the day.  Protect yourself by wearing long sleeves, pants, a wide-brimmed hat, and sunglasses whenever you are outside. Heart disease, diabetes, and high blood pressure  High blood pressure causes heart disease and increases the risk of stroke. High blood pressure is more likely to develop in: ? People who have blood pressure in the high end of  the normal range (130-139/85-89 mm Hg). ? People who are overweight or obese. ? People who are African American.  If you are 56-68 years of age, have your blood pressure checked every 3-5 years. If you are 49 years of age or older, have your blood pressure checked every year. You should have your blood pressure measured twice-once when you are at a hospital or clinic, and once when you are not at a hospital or clinic. Record the average of the two measurements. To check your blood pressure when you are not at a hospital or clinic, you can use: ? An automated blood pressure machine at a pharmacy. ? A home blood pressure monitor.  If you are between 17 years and 65 years old, ask your health care provider if you should take aspirin to prevent strokes.  Have regular diabetes screenings. This involves taking a blood sample to check your fasting blood sugar level. ? If you are at a normal weight and have a low risk for  diabetes, have this test once every three years after 54 years of age. ? If you are overweight and have a high risk for diabetes, consider being tested at a younger age or more often. Preventing infection Hepatitis B  If you have a higher risk for hepatitis B, you should be screened for this virus. You are considered at high risk for hepatitis B if: ? You were born in a country where hepatitis B is common. Ask your health care provider which countries are considered high risk. ? Your parents were born in a high-risk country, and you have not been immunized against hepatitis B (hepatitis B vaccine). ? You have HIV or AIDS. ? You use needles to inject street drugs. ? You live with someone who has hepatitis B. ? You have had sex with someone who has hepatitis B. ? You get hemodialysis treatment. ? You take certain medicines for conditions, including cancer, organ transplantation, and autoimmune conditions. Hepatitis C  Blood testing is recommended for: ? Everyone born from [birth year] through 1966. ? Anyone with known risk factors for hepatitis C. Sexually transmitted infections (STIs)  You should be screened for sexually transmitted infections (STIs) including gonorrhea and chlamydia if: ? You are sexually active and are younger than 54 years of age. ? You are older than 54 years of age and your health care provider tells you that you are at risk for this type of infection. ? Your sexual activity has changed since you were last screened and you are at an increased risk for chlamydia or gonorrhea. Ask your health care provider if you are at risk.  If you do not have HIV, but are at risk, it may be recommended that you take a prescription medicine daily to prevent HIV infection. This is called pre-exposure prophylaxis (PrEP). You are considered at risk if: ? You are sexually active and do not regularly use condoms or know the HIV status of your partner(s). ? You take drugs by injection. ? You are  sexually active with a partner who has HIV. Talk with your health care provider about whether you are at high risk of being infected with HIV. If you choose to begin PrEP, you should first be tested for HIV. You should then be tested every 3 months for as long as you are taking PrEP. Pregnancy  If you are premenopausal and you may become pregnant, ask your health care provider about preconception counseling.  If you may become pregnant, take 400  to 800 micrograms (mcg) of folic acid every day.  If you want to prevent pregnancy, talk to your health care provider about birth control (contraception). Osteoporosis and menopause  Osteoporosis is a disease in which the bones lose minerals and strength with aging. This can result in serious bone fractures. Your risk for osteoporosis can be identified using a bone density scan.  If you are 60 years of age or older, or if you are at risk for osteoporosis and fractures, ask your health care provider if you should be screened.  Ask your health care provider whether you should take a calcium or vitamin D supplement to lower your risk for osteoporosis.  Menopause may have certain physical symptoms and risks.  Hormone replacement therapy may reduce some of these symptoms and risks. Talk to your health care provider about whether hormone replacement therapy is right for you. Follow these instructions at home:  Schedule regular health, dental, and eye exams.  Stay current with your immunizations.  Do not use any tobacco products including cigarettes, chewing tobacco, or electronic cigarettes.  If you are pregnant, do not drink alcohol.  If you are breastfeeding, limit how much and how often you drink alcohol.  Limit alcohol intake to no more than 1 drink per day for nonpregnant women. One drink equals 12 ounces of beer, 5 ounces of wine, or 1 ounces of hard liquor.  Do not use street drugs.  Do not share needles.  Ask your health care  provider for help if you need support or information about quitting drugs.  Tell your health care provider if you often feel depressed.  Tell your health care provider if you have ever been abused or do not feel safe at home. This information is not intended to replace advice given to you by your health care provider. Make sure you discuss any questions you have with your health care provider. Document Released: 08/02/2010 Document Revised: 06/25/2015 Document Reviewed: 10/21/2014 Elsevier Interactive Patient Education  [employer].   If you have lab work done today you will be contacted with your lab results within the next 2 weeks.  If you have not heard from [place] then please contact us. The fastest way to get your results is to register for My Chart.   IF you received an x-ray today, you will receive an invoice from [facility]. Please contact [facility] at [phone] with questions or concerns regarding your invoice.   IF you received labwork today, you will receive an invoice from [facility]. Please contact [facility] at [phone] with questions or concerns regarding your invoice.   Our billing staff will not be able to assist you with questions regarding bills from these companies.  You will be contacted with the lab results as soon as they are available. The fastest way to get your results is to activate your My Chart account. Instructions are located on the last page of this paperwork. If you have not heard from [place] regarding the results in 2 weeks, please contact this office.

## 2018-02-24 LAB — COMPREHENSIVE METABOLIC PANEL
ALT: 14 IU/L (ref 0–32)
AST: 14 IU/L (ref 0–40)
Albumin/Globulin Ratio: 1.6 (ref 1.2–2.2)
Albumin: 4.2 g/dL (ref 3.8–4.9)
Alkaline Phosphatase: 79 IU/L (ref 39–117)
BUN/Creatinine Ratio: 12 (ref 9–23)
BUN: 10 mg/dL (ref 6–24)
Bilirubin Total: 0.4 mg/dL (ref 0.0–1.2)
CO2: 23 mmol/L (ref 20–29)
Calcium: 9.6 mg/dL (ref 8.7–10.2)
Chloride: 103 mmol/L (ref 96–106)
Creatinine, Ser: 0.83 mg/dL (ref 0.57–1.00)
GFR calc Af Amer: 93 mL/min/{1.73_m2} (ref >59)
GFR calc non Af Amer: 81 mL/min/{1.73_m2} (ref >59)
Globulin, Total: 2.6 g/dL (ref 1.5–4.5)
Glucose: 80 mg/dL (ref 65–99)
Potassium: 4.1 mmol/L (ref 3.5–5.2)
Sodium: 143 mmol/L (ref 134–144)
Total Protein: 6.8 g/dL (ref 6.0–8.5)

## 2018-02-24 LAB — LIPID PANEL
Chol/HDL Ratio: 3.5 ratio (ref 0.0–4.4)
Cholesterol, Total: 215 mg/dL — ABNORMAL HIGH (ref 100–199)
HDL: 62 mg/dL (ref >39)
LDL Calculated: 134 mg/dL — ABNORMAL HIGH (ref 0–99)
Triglycerides: 97 mg/dL (ref 0–149)
VLDL Cholesterol Cal: 19 mg/dL (ref 5–40)

## 2018-02-24 LAB — CBC WITH DIFFERENTIAL/PLATELET
Basophils Absolute: 0.1 10*3/uL (ref 0.0–0.2)
Basos: 1 %
EOS (ABSOLUTE): 0.2 10*3/uL (ref 0.0–0.4)
Eos: 2 %
Hematocrit: 39.3 % (ref 34.0–46.6)
Hemoglobin: 13 g/dL (ref 11.1–15.9)
Immature Grans (Abs): 0 10*3/uL (ref 0.0–0.1)
Immature Granulocytes: 0 %
Lymphocytes Absolute: 3.2 10*3/uL — ABNORMAL HIGH (ref 0.7–3.1)
Lymphs: 36 %
MCH: 30.4 pg (ref 26.6–33.0)
MCHC: 33.1 g/dL (ref 31.5–35.7)
MCV: 92 fL (ref 79–97)
Monocytes Absolute: 0.4 10*3/uL (ref 0.1–0.9)
Monocytes: 5 %
Neutrophils Absolute: 5.1 10*3/uL (ref 1.4–7.0)
Neutrophils: 56 %
Platelets: 366 10*3/uL (ref 150–450)
RBC: 4.27 x10E6/uL (ref 3.77–5.28)
RDW: 13.2 % (ref 11.7–15.4)
WBC: 9 10*3/uL (ref 3.4–10.8)

## 2018-02-28 ENCOUNTER — Ambulatory Visit
Admission: RE | Admit: 2018-02-28 | Discharge: 2018-02-28 | Disposition: A | Discharge status: Home / Self Care | Payer: BLUE CROSS/BLUE SHIELD | Source: Ambulatory Visit | Attending: Nurse Practitioner | Admitting: Nurse Practitioner

## 2018-02-28 DIAGNOSIS — R221 Localized swelling, mass and lump, neck: Secondary | ICD-10-CM

## 2018-04-11 ENCOUNTER — Ambulatory Visit
Admission: RE | Admit: 2018-04-11 | Discharge: 2018-04-11 | Disposition: A | Discharge status: Home / Self Care | Payer: BLUE CROSS/BLUE SHIELD | Source: Ambulatory Visit | Attending: Nurse Practitioner | Admitting: Nurse Practitioner

## 2018-04-11 ENCOUNTER — Other Ambulatory Visit: Payer: Self-pay

## 2018-04-11 DIAGNOSIS — Z1231 Encounter for screening mammogram for malignant neoplasm of breast: Secondary | ICD-10-CM

## 2018-04-24 ENCOUNTER — Telehealth: Payer: Self-pay

## 2018-04-24 NOTE — Telephone Encounter (Signed)
Left message to try to make appt

## 2018-04-25 ENCOUNTER — Ambulatory Visit: Payer: Medicaid Other | Admitting: Nurse Practitioner

## 2018-04-25 ENCOUNTER — Other Ambulatory Visit: Payer: Self-pay

## 2018-04-25 ENCOUNTER — Encounter: Payer: Self-pay | Admitting: Nurse Practitioner

## 2018-04-25 VITALS — BP 126/88 | HR 80 | Temp 99.6°F | Ht 66.6 in | Wt 209.8 lb

## 2018-04-25 DIAGNOSIS — J3489 Other specified disorders of nose and nasal sinuses: Secondary | ICD-10-CM | POA: Diagnosis not present

## 2018-04-25 DIAGNOSIS — J0111 Acute recurrent frontal sinusitis: Secondary | ICD-10-CM

## 2018-04-25 MED ORDER — AMOXICILLIN-POT CLAVULANATE 875-125 MG PO TABS: 1.0000 | ORAL_TABLET | Freq: Two times a day (BID) | ORAL | 0 refills | Status: AC

## 2018-04-25 NOTE — Progress Notes (Signed)
  Subjective:     Patient ID: Makayla Aguilar , female    DOB: [birth year] , 53 y.o.   MRN: [id number]   Chief Complaint  Patient presents with  . Sinusitis    HPI  Sinusitis  This is a new problem. There has been no fever. Pertinent negatives include no chills or headaches.     No past medical history on file.   Family History  Problem Relation Age of Onset  . Alcohol abuse Mother   . Diabetes Father     No current outpatient medications on file.   Allergies  Allergen Reactions  . Hydrocodone     Gets really high     Review of Systems  Constitutional: Negative for chills and fatigue.  Eyes: Negative for photophobia.  Respiratory: Negative.   Cardiovascular: Negative.  Negative for chest pain, palpitations and leg swelling.  Neurological: Negative for dizziness and headaches.     Today's Vitals   04/25/18 0906  BP: 126/88  Pulse: 80  Temp: 99.6 F (37.6 C)  TempSrc: Oral  Weight: 209 lb 12.8 oz (95.2 kg)  Height: 5' 6.6" (1.692 m)   Body mass index is 33.26 kg/m.   Objective:  Physical Exam Constitutional:      Appearance: Normal appearance.  HENT:     Head: Normocephalic and atraumatic.     Right Ear: Tympanic membrane is bulging.     Left Ear: Tympanic membrane is bulging.     Nose: Nose normal. No congestion or rhinorrhea.     Mouth/Throat:     Mouth: Mucous membranes are moist.  Eyes:     Extraocular Movements: Extraocular movements intact.     Pupils: Pupils are equal, round, and reactive to light.  Cardiovascular:     Rate and Rhythm: Normal rate and regular rhythm.     Pulses: Normal pulses.     Heart sounds: Normal heart sounds. No murmur.  Pulmonary:     Effort: Pulmonary effort is normal. No respiratory distress.     Breath sounds: Normal breath sounds. No wheezing.  Skin:    General: Skin is warm and dry.     Capillary Refill: Capillary refill takes less than 2 seconds.  Neurological:     General: No focal deficit present.   Mental Status: She is alert and oriented to person, place, and time.  Psychiatric:        Mood and Affect: Mood normal.        Behavior: Behavior normal.        Thought Content: Thought content normal.        Judgment: Judgment normal.         Assessment And Plan:     1. Sinus pressure  If not better in 2 weeks return call so we can refer to ENT  - amoxicillin-clavulanate (AUGMENTIN) 875-125 MG tablet; Take 1 tablet by mouth 2 (two) times daily for 7 days.  Dispense: 14 tablet; Refill: 0  2. Acute recurrent frontal sinusitis  She was treated in January and was better for a brief time  Will treat again and advised to take allegra daily  However due to the poor sense of smell will consider referral to the ENT if not better   [name], FNP

## 2018-04-25 NOTE — Patient Instructions (Signed)
Allergic Rhinitis, Adult Allergic rhinitis is a reaction to allergens in the air. Allergens are tiny specks (particles) in the air that cause your body to have an allergic reaction. This condition cannot be passed from person to person (is not contagious). Allergic rhinitis cannot be cured, but it can be controlled. There are two types of allergic rhinitis:  Seasonal. This type is also called hay fever. It happens only during certain times of the year.  Perennial. This type can happen at any time of the year. What are the causes? This condition may be caused by:  Pollen from grasses, trees, and weeds.  House dust mites.  Pet dander.  Mold. What are the signs or symptoms? Symptoms of this condition include:  Sneezing.  Runny or stuffy nose (nasal congestion).  A lot of mucus in the back of the throat (postnasal drip).  Itchy nose.  Tearing of the eyes.  Trouble sleeping.  Being sleepy during day. How is this treated? There is no cure for this condition. You should avoid things that trigger your symptoms (allergens). Treatment can help to relieve symptoms. This may include:  Medicines that block allergy symptoms, such as antihistamines. These may be given as a shot, nasal spray, or pill.  Shots that are given until your body becomes less sensitive to the allergen (desensitization).  Stronger medicines, if all other treatments have not worked. Follow these instructions at home: Avoiding allergens   Find out what you are allergic to. Common allergens include smoke, dust, and pollen.  Avoid them if you can. These are some of the things that you can do to avoid allergens: ? Replace carpet with wood, tile, or vinyl flooring. Carpet can trap dander and dust. ? Clean any mold found in the home. ? Do not smoke. Do not allow smoking in your home. ? Change your heating and air conditioning filter at least once a month. ? During allergy season:  Keep windows closed as much as  you can. If possible, use air conditioning when there is a lot of pollen in the air.  Use a special filter for allergies with your furnace and air conditioner.  Plan outdoor activities when pollen counts are lowest. This is usually during the early morning or evening hours.  If you do go outdoors when pollen count is high, wear a special mask for people with allergies.  When you come indoors, take a shower and change your clothes before sitting on furniture or bedding. General instructions  Do not use fans in your home.  Do not hang clothes outside to dry.  Wear sunglasses to keep pollen out of your eyes.  Wash your hands right away after you touch household pets.  Take over-the-counter and prescription medicines only as told by your doctor.  Keep all follow-up visits as told by your doctor. This is important. Contact a doctor if:  You have a fever.  You have a cough that does not go away (is persistent).  You start to make whistling sounds when you breathe (wheeze).  Your symptoms do not get better with treatment.  You have thick fluid coming from your nose.  You start to have nosebleeds. Get help right away if:  Your tongue or your lips are swollen.  You have trouble breathing.  You feel dizzy or you feel like you are going to pass out (faint).  You have cold sweats. Summary  Allergic rhinitis is a reaction to allergens in the air.  This condition may be   caused by allergens. These include pollen, dust mites, pet dander, and mold.  Symptoms include a runny, itchy nose, sneezing, or tearing eyes. You may also have trouble sleeping or feel sleepy during the day.  Treatment includes taking medicines and avoiding allergens. You may also get shots or take stronger medicines.  Get help if you have a fever or a cough that does not stop. Get help right away if you are short of breath. This information is not intended to replace advice given to you by your health care  provider. Make sure you discuss any questions you have with your health care provider. Document Released: 05/19/2010 Document Revised: 08/08/2017 Document Reviewed: 08/08/2017 Elsevier Interactive Patient Education  2019 [employer].   May use Nettie pot as needed  Take allegra daily during the peak season  May use natural bee honey or bee pollen to help desensitize you.    Avoid opening windows and being outside for long periods.

## 2018-05-01 ENCOUNTER — Other Ambulatory Visit: Payer: Self-pay | Admitting: Nurse Practitioner

## 2018-05-01 DIAGNOSIS — J0111 Acute recurrent frontal sinusitis: Secondary | ICD-10-CM

## 2018-05-01 DIAGNOSIS — R430 Anosmia: Secondary | ICD-10-CM

## 2018-05-01 MED ORDER — MOMETASONE FUROATE 50 MCG/ACT NA SUSP: 2.0000 | Freq: Every day | NASAL | 2 refills | Status: DC

## 2018-06-06 DIAGNOSIS — J342 Deviated nasal septum: Secondary | ICD-10-CM | POA: Insufficient documentation

## 2018-06-06 DIAGNOSIS — H6121 Impacted cerumen, right ear: Secondary | ICD-10-CM | POA: Insufficient documentation

## 2018-06-06 DIAGNOSIS — H6993 Unspecified Eustachian tube disorder, bilateral: Secondary | ICD-10-CM | POA: Insufficient documentation

## 2018-06-06 DIAGNOSIS — J343 Hypertrophy of nasal turbinates: Secondary | ICD-10-CM | POA: Insufficient documentation

## 2018-07-18 DIAGNOSIS — R430 Anosmia: Secondary | ICD-10-CM | POA: Insufficient documentation

## 2018-07-18 DIAGNOSIS — J329 Chronic sinusitis, unspecified: Secondary | ICD-10-CM | POA: Diagnosis not present

## 2018-07-18 DIAGNOSIS — J343 Hypertrophy of nasal turbinates: Secondary | ICD-10-CM | POA: Diagnosis not present

## 2018-07-18 DIAGNOSIS — J342 Deviated nasal septum: Secondary | ICD-10-CM | POA: Diagnosis not present

## 2018-07-18 DIAGNOSIS — J310 Chronic rhinitis: Secondary | ICD-10-CM | POA: Diagnosis not present

## 2018-08-27 ENCOUNTER — Ambulatory Visit: Payer: Medicaid Other | Admitting: Nurse Practitioner

## 2018-08-27 ENCOUNTER — Other Ambulatory Visit: Payer: Self-pay

## 2018-08-27 ENCOUNTER — Encounter: Payer: Self-pay | Admitting: Nurse Practitioner

## 2018-08-27 VITALS — BP 144/90 | HR 91 | Temp 98.1°F | Ht 63.6 in | Wt 218.4 lb

## 2018-08-27 DIAGNOSIS — R5383 Other fatigue: Secondary | ICD-10-CM | POA: Diagnosis not present

## 2018-08-27 DIAGNOSIS — I10 Essential (primary) hypertension: Secondary | ICD-10-CM | POA: Diagnosis not present

## 2018-08-27 MED ORDER — LOSARTAN POTASSIUM 25 MG PO TABS: 25.0000 mg | ORAL_TABLET | Freq: Every day | ORAL | 2 refills | Status: DC

## 2018-08-27 NOTE — Patient Instructions (Addendum)
Healthy Eating °Following a healthy eating pattern may help you to achieve and maintain a healthy body weight, reduce the risk of chronic disease, and live a long and productive life. It is important to follow a healthy eating pattern at an appropriate calorie level for your body. Your nutritional needs should be met primarily through food by choosing a variety of nutrient-rich foods. °What are tips for following this plan? °Reading food labels °· Read labels and choose the following: °? Reduced or low sodium. °? Juices with 100% fruit juice. °? Foods with low saturated fats and high polyunsaturated and monounsaturated fats. °? Foods with whole grains, such as whole wheat, cracked wheat, brown rice, and wild rice. °? Whole grains that are fortified with folic acid. This is recommended for women who are pregnant or who want to become pregnant. °· Read labels and avoid the following: °? Foods with a lot of added sugars. These include foods that contain brown sugar, corn sweetener, corn syrup, dextrose, fructose, glucose, high-fructose corn syrup, honey, invert sugar, lactose, malt syrup, maltose, molasses, raw sugar, sucrose, trehalose, or turbinado sugar. °§ Do not eat more than the following amounts of added sugar per day: °§ 6 teaspoons (25 g) for women. °§ 9 teaspoons (38 g) for men. °? Foods that contain processed or refined starches and grains. °? Refined grain products, such as white flour, degermed cornmeal, white bread, and white rice. °Shopping °· Choose nutrient-rich snacks, such as vegetables, whole fruits, and nuts. Avoid high-calorie and high-sugar snacks, such as potato chips, fruit snacks, and candy. °· Use oil-based dressings and spreads on foods instead of solid fats such as butter, stick margarine, or cream cheese. °· Limit pre-made sauces, mixes, and "instant" products such as flavored rice, instant noodles, and ready-made pasta. °· Try more plant-protein sources, such as tofu, tempeh, black beans,  edamame, lentils, nuts, and seeds. °· Explore eating plans such as the Mediterranean diet or vegetarian diet. °Cooking °· Use oil to sauté or stir-fry foods instead of solid fats such as butter, stick margarine, or lard. °· Try baking, boiling, grilling, or broiling instead of frying. °· Remove the fatty part of meats before cooking. °· Steam vegetables in water or broth. °Meal planning ° °· At meals, imagine dividing your plate into fourths: °? One-half of your plate is fruits and vegetables. °? One-fourth of your plate is whole grains. °? One-fourth of your plate is protein, especially lean meats, poultry, eggs, tofu, beans, or nuts. °· Include low-fat dairy as part of your daily diet. °Lifestyle °· Choose healthy options in all settings, including home, work, school, restaurants, or stores. °· Prepare your food safely: °? Wash your hands after handling raw meats. °? Keep food preparation surfaces clean by regularly washing with hot, soapy water. °? Keep raw meats separate from ready-to-eat foods, such as fruits and vegetables. °? Cook seafood, meat, poultry, and eggs to the recommended internal temperature. °? Store foods at safe temperatures. In general: °§ Keep cold foods at 40°F (4.4°C) or below. °§ Keep hot foods at 140°F (60°C) or above. °§ Keep your freezer at 0°F (-17.8°C) or below. °§ Foods are no longer safe to eat when they have been between the temperatures of 40°-140°F (4.4-60°C) for more than 2 hours. °What foods should I eat? °Fruits °Aim to eat 2 cup-equivalents of fresh, canned (in natural juice), or frozen fruits each day. Examples of 1 cup-equivalent of fruit include 1 small apple, 8 large strawberries, 1 cup canned fruit, ½ cup   dried fruit, or 1 cup 100% juice. Vegetables Aim to eat 2-3 cup-equivalents of fresh and frozen vegetables each day, including different varieties and colors. Examples of 1 cup-equivalent of vegetables include 2 medium carrots, 2 cups raw, leafy greens, 1 cup chopped  vegetable (raw or cooked), or 1 medium baked potato. Grains Aim to eat 6 ounce-equivalents of whole grains each day. Examples of 1 ounce-equivalent of grains include 1 slice of bread, 1 cup ready-to-eat cereal, 3 cups popcorn, or  cup cooked rice, pasta, or cereal. Meats and other proteins Aim to eat 5-6 ounce-equivalents of protein each day. Examples of 1 ounce-equivalent of protein include 1 egg, 1/2 cup nuts or seeds, or 1 tablespoon (16 g) peanut butter. A cut of meat or fish that is the size of a deck of cards is about 3-4 ounce-equivalents.  Of the protein you eat each week, try to have at least 8 ounces come from seafood. This includes salmon, trout, herring, and anchovies. Dairy Aim to eat 3 cup-equivalents of fat-free or low-fat dairy each day. Examples of 1 cup-equivalent of dairy include 1 cup (240 mL) milk, 8 ounces (250 g) yogurt, 1 ounces (44 g) natural cheese, or 1 cup (240 mL) fortified soy milk. Fats and oils  Aim for about 5 teaspoons (21 g) per day. Choose monounsaturated fats, such as canola and olive oils, avocados, peanut butter, and most nuts, or polyunsaturated fats, such as sunflower, corn, and soybean oils, walnuts, pine nuts, sesame seeds, sunflower seeds, and flaxseed. Beverages  Aim for six 8-oz glasses of water per day. Limit coffee to three to five 8-oz cups per day.  Limit caffeinated beverages that have added calories, such as soda and energy drinks.  Limit alcohol intake to no more than 1 drink a day for nonpregnant women and 2 drinks a day for men. One drink equals 12 oz of beer (355 mL), 5 oz of wine (148 mL), or 1 oz of hard liquor (44 mL). Seasoning and other foods  Avoid adding excess amounts of salt to your foods. Try flavoring foods with herbs and spices instead of salt.  Avoid adding sugar to foods.  Try using oil-based dressings, sauces, and spreads instead of solid fats. This information is based on general [place] nutrition guidelines. For more  information, visit [employer]. Exact amounts may vary based on your nutrition needs. Summary  A healthy eating plan may help you to maintain a healthy weight, reduce the risk of chronic diseases, and stay active throughout your life.  Plan your meals. Make sure you eat the right portions of a variety of nutrient-rich foods.  Try baking, boiling, grilling, or broiling instead of frying.  Choose healthy options in all settings, including home, work, school, restaurants, or stores. This information is not intended to replace advice given to you by your health care provider. Make sure you discuss any questions you have with your health care provider. Document Released: 05/01/2017 Document Revised: 05/01/2017 Document Reviewed: 05/01/2017 Elsevier Patient Education  2020 [employer] that are in packages or containers have a Nutrition Facts panel on the side or back. This is commonly called the food label. The food label helps you to make informed food choices by providing information about serving size and the amount of calories and various nutrients in the food. You can check the food label to find out if the food contains high or low amounts of items that you want to limit in  your diet. You can also use the food label to see if the food is a good source of the nutrients that you want to include in your diet. How do I read the food label?  Start by looking at the serving size and servings per package.  Check the calories.  Check the amount of fat, cholesterol, and sodium. Try to limit these nutrients.  Check the amount of dietary fiber, protein, and other vitamins and minerals listed. Depending on recommendations from your health care provider or dietitian, certain values may be more important to your overall health and diet than others.  Look at the ingredient list. Depending on your dietary needs, you may need to avoid foods with certain ingredients.  Talk to your health care provider or dietitian about what ingredients you should watch for. What does the information on the food label mean? Serving size  This indicates the amount of the food that makes up one serving. All of the nutrition information listed on the food label is based on one serving.  Serving size may be based on: ? The number of food pieces. ? The volume of food (cups, fluid ounces, tablespoons). ? The weight of food (grams, ounces).  The label will also indicate how many servings are in one package. If you eat more than one serving, you must multiply the amounts (such as calories, grams of saturated fat, or milligrams of sodium) by the number of servings. Calories  Calories are a measure of the amount of energy that your body gets from the food.  Calories from fat is a measure of the amount of energy in the food that comes from fats.  Most foods list only the calories in one serving of food. Some foods may list the number of calories per package if one package contains slightly more than one serving.  Counting total daily calories is one way that is used to help manage weight.  Talk to your health care provider or dietitian about how many calories you should eat each day. Percent daily value  Percent daily value (% Daily Value) tells you what percent of the daily value for each nutrient one serving provides. The daily value is the recommended total amount of the item that you should get each day. For example, if 15% is listed next to dietary fiber, it means that one serving of the food will give you 15% of the recommended amount of fiber that you should get in a day. The daily values are based on a 2,000-calorie-per-day diet. You may get more or less than 2,000 calories in your diet each day, but the % Daily Value gives you an idea of whether the food contains a high or low amount of the listed item. ? 5 percent daily value (%DV) or less means there is a low amount of a  nutrient in one serving. ? 20 percent daily value (%DV) or higher means there is a high amount of a nutrient in one serving. Total fat  Total fat shows you the number of grams (g) of fat in one serving. Two of the fats that make up a portion of the total fat are included on the label: ? Saturated fat. The food label shows both the amount of fat in grams (g) and the percent daily value per serving. This type of fat increases the amount of blood cholesterol. If you eat 2,000 calories each day, you should eat less than 13 g of saturated fat each day. ?  Trans fat. The food label shows the number of grams (g) per serving. This type of fat is the most unhealthy fat for heart health. It is recommended that people limit their intake of trans fat to as little as possible. Look for foods that have "0g Trans Fat" on the label. Cholesterol  Cholesterol tells you the number of milligrams (mg) and the percent daily value of cholesterol in one serving. Cholesterol is a fat-like substance. It can be harmful if you eat too much of it. If you eat 2,000 calories each day, you should eat less than 300 mg of cholesterol each day. Sodium  Sodium tells you the number of milligrams (mg) and the percent daily value of salt in one serving. If eaten in large amounts, sodium can raise your blood pressure. Most people should limit their sodium intake to 2,300 mg a day. Total carbohydrate  Total carbohydrate shows you the number of grams (g) of carbohydrates in one serving. Two types of carbohydrates make up the total carbohydrates included on the label: ? Dietary fiber. The food label shows both the amount of dietary fiber in grams (g) and the percent daily value per serving. Most people should eat at least 25 g of dietary fiber each day. ? Sugars. The food label shows the number of grams (g) of sugars per serving. This value includes both naturally occurring sugars from fruit and milk and added sugars, such as honey or table  sugar. Protein  Protein tells you how many grams (g) of protein are in one serving. The recommended amount of daily protein differs for men and women, and it may depend on your overall health. Talk to your health care provider or dietitian about how much protein you should eat each day. Vitamins and minerals  The food label shows the percent daily value (%DV) for vitamins and minerals such as vitamin A, vitamin C, calcium, and iron. Other vitamins and minerals may be listed depending on the food. Ingredients  Food labels list each ingredient in the food. The ingredients are listed in the order of their amount by weight from most to least.  Food labels may also include a warning about ingredients that can cause allergic reactions in some people. These may be indicated by the words "Contains" or "May contain." Examples of ingredients that may be listed are wheat, dairy, eggs, soy, and nuts. If a person knows that he or she is allergic to one of these ingredients, he or she will know to avoid that food. Where to find more information  [place] Food and Drug Administration: [employer] Summary  The food label is the common term for the Nutrition Facts panel on the side or back of food packages or containers.  The food label helps you to make informed food choices by providing information about serving size and the amount of calories and various nutrients in the food.  To read the food label, begin by checking the serving size and number of servings in the container. Then check the calories and the amount of each listed item. This information is not intended to replace advice given to you by your health care provider. Make sure you discuss any questions you have with your health care provider. Document Released: 01/17/2005 Document Revised: 05/27/2016 Document Reviewed: 02/23/2016 Elsevier Patient Education  2020 [employer].

## 2018-08-27 NOTE — Progress Notes (Addendum)
  Subjective:     Patient ID: Makayla Aguilar , female    DOB: 1965/02/21 , 53 y.o.   MRN: [id number]   Chief Complaint  Patient presents with  . Fatigue    patient states she went to ENT and they didnt help at all she was just told what the issue was and that there is nothing they could do    HPI  She is here today because she continues to have a "full feeling in frontal area of head" she is also really fatigued and states "I just don't feel right". She has been to the ENT and advised to take nasal spray and she has a deviated nasal septum in a "Z" shape.      No past medical history on file.   Family History  Problem Relation Age of Onset  . Alcohol abuse Mother   . Diabetes Father     No current outpatient medications on file.   Allergies  Allergen Reactions  . Hydrocodone     Gets really high     Review of Systems  Constitutional: Negative.  Negative for fatigue.  Respiratory: Negative.   Cardiovascular: Negative.   Neurological: Negative for dizziness and headaches.     Today's Vitals   08/27/18 1456  BP: (!) 144/90  Pulse: 91  Temp: 98.1 F (36.7 C)  TempSrc: Oral  Weight: 218 lb 6.4 oz (99.1 kg)  Height: 5' 3.6" (1.615 m)  PainSc: 0-No pain   Body mass index is 37.96 kg/m.   Objective:  Physical Exam Constitutional:      Appearance: Normal appearance.  Cardiovascular:     Rate and Rhythm: Normal rate and regular rhythm.     Pulses: Normal pulses.     Heart sounds: Normal heart sounds. No murmur.  Pulmonary:     Effort: Pulmonary effort is normal. No respiratory distress.     Breath sounds: Normal breath sounds.  Skin:    General: Skin is warm and dry.     Capillary Refill: Capillary refill takes less than 2 seconds.  Neurological:     General: No focal deficit present.     Mental Status: She is alert and oriented to person, place, and time.         Assessment And Plan:    1. Essential hypertension  Continues to be elevated we need to  start a medication this may be the cause of her headache and not feeling well  I will also refer for a sleep study since has been elevated multiple times in the last few months - losartan (COZAAR) 25 MG tablet; Take 1 tablet (25 mg total) by mouth daily.  Dispense: 30 tablet; Refill: 2 - Vitamin B12 - Ambulatory referral to Sleep Studies - Amb ref to Medical Nutrition Therapy-MNT  2. Other fatigue  Will send for sleep study  Has been told she snores as well  This can be the cause for her weight issues and hypertension  The ENT could not explain her feeling pressure in the forehead - Thyroid antibodies - Amb ref to [place], FNP    THE PATIENT IS ENCOURAGED TO PRACTICE SOCIAL DISTANCING DUE TO THE COVID-19 PANDEMIC.

## 2018-08-28 LAB — VITAMIN B12: Vitamin B-12: 275 pg/mL (ref 232–1245)

## 2018-08-28 LAB — THYROID ANTIBODIES
Thyroglobulin Antibody: <1 IU/mL (ref 0.0–0.9)
Thyroperoxidase Ab SerPl-aCnc: <6 IU/mL (ref 0–34)

## 2018-09-05 ENCOUNTER — Ambulatory Visit: Payer: Medicaid Other | Admitting: Neurology

## 2018-09-05 ENCOUNTER — Other Ambulatory Visit: Payer: Self-pay

## 2018-09-05 ENCOUNTER — Encounter: Payer: Self-pay | Admitting: Neurology

## 2018-09-05 VITALS — BP 165/98 | HR 82 | Ht 67.0 in | Wt 218.0 lb

## 2018-09-05 DIAGNOSIS — R351 Nocturia: Secondary | ICD-10-CM

## 2018-09-05 DIAGNOSIS — G4719 Other hypersomnia: Secondary | ICD-10-CM

## 2018-09-05 DIAGNOSIS — R0683 Snoring: Secondary | ICD-10-CM | POA: Diagnosis not present

## 2018-09-05 DIAGNOSIS — R519 Headache, unspecified: Secondary | ICD-10-CM

## 2018-09-05 DIAGNOSIS — E669 Obesity, unspecified: Secondary | ICD-10-CM

## 2018-09-05 DIAGNOSIS — R51 Headache: Secondary | ICD-10-CM

## 2018-09-05 NOTE — Patient Instructions (Signed)

## 2018-09-05 NOTE — Progress Notes (Signed)
Subjective:    Patient ID: Makayla Aguilar is a 53 y.o. female.  HPI     Huston FoleySaima Randye Treichler, MD, PhD [facility] [address], [address] [address] [place], [place] [address]  Dear Lolita CramJanece,   I saw your patient, Makayla Aguilar, upon your kind request in my sleep clinic today for initial consultation of her sleep disorder, in particular, concern for underlying obstructive sleep apnea.  The patient is unaccompanied today.  As you know, Makayla Aguilar is a 53 year old right-handed woman with an underlying medical history of hypertension and obesity, who reports snoring and excessive daytime somnolence.  She has had some recurrent frontal headaches for which she saw ENT.  She was given a nasal spray.  Her Epworth sleepiness score is 13 out of 24 today, fatigue severity score is 63 out of 63.  I reviewed your office visit note from 08/27/2018.  She reports difficulty initiating and maintaining sleep.  She does not typically take any medication for this.  She has a bedtime of around 8 PM but it is not unusual for her to fall asleep after a delay of almost 3 hours.  She does not watch TV or use her phone in the bedroom.  She lives with her 54 year old son who knows to be quiet after 8 PM.  She has no pets in the household.  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  She is trying to quit smoking, currently smokes at least 10/day but has plans to not buy any more cigarettes, she is on her last pack.  She does not drink alcohol and caffeine and limitation, soda for lunch but otherwise no coffee or tea, she has increased her water intake.  She has nocturia about 2-3 times per average night.  She has woken up with a dull and achy headache and sometimes a migraine-like headache.  She had intermittent migraines in the past.  She was told that she had a deviated septum but no surgery was recommended.  She had seen Dr. Annalee GentaShoemaker.  She has tried a  nasal spray which did not help very much.  She is trying to lose weight.  Her rise time varies, it is not unusual for her to wake up around 4.  She tries to walk in the mornings when it is not so hot.  Her Past Medical History Is Significant For: No past medical history on file.  Her Past Surgical History Is Significant For: Past Surgical History:  Procedure Laterality Date  . ABDOMINAL HYSTERECTOMY      Her Family History Is Significant For: Family History  Problem Relation Age of Onset  . Alcohol abuse Mother   . Diabetes Father     Her Social History Is Significant For: Social History   Socioeconomic History  . Marital status: Single    Spouse name: Not on file  . Number of children: Not on file  . Years of education: Not on file  . Highest education level: Not on file  Occupational History  . Not on file  Social Needs  . Financial resource strain: Not on file  . Food insecurity    Worry: Not on file    Inability: Not on file  . Transportation needs    Medical: Not on file    Non-medical: Not on file  Tobacco Use  . Smoking status: Current Every Day Smoker    Packs/day: 0.50    Types: Cigarettes  .  Smokeless tobacco: Never Used  Substance and Sexual Activity  . Alcohol use: Not Currently  . Drug use: Not Currently  . Sexual activity: Not on file  Lifestyle  . Physical activity    Days per week: Not on file    Minutes per session: Not on file  . Stress: Not on file  Relationships  . Social Herbalist on phone: Not on file    Gets together: Not on file    Attends religious service: Not on file    Active member of club or organization: Not on file    Attends meetings of clubs or organizations: Not on file    Relationship status: Not on file  Other Topics Concern  . Not on file  Social History Narrative  . Not on file    Her Allergies Are:  Allergies  Allergen Reactions  . Hydrocodone     Gets really high  :   Her Current Medications  Are:  Outpatient Encounter Medications as of 09/05/2018  Medication Sig  . losartan (COZAAR) 25 MG tablet Take 1 tablet (25 mg total) by mouth daily. (Patient not taking: Reported on 09/05/2018)   No facility-administered encounter medications on file as of 09/05/2018.   :  Review of Systems:  Out of a complete 14 point review of systems, all are reviewed and negative with the exception of these symptoms as listed below:  Review of Systems  Neurological:       Pt presents today to discuss her sleep. Pt has never had a sleep study but does endorse snoring.  Epworth Sleepiness Scale 0= would never doze 1= slight chance of dozing 2= moderate chance of dozing 3= high chance of dozing  Sitting and reading: 3 Watching TV: 2 Sitting inactive in a public place (ex. Theater or meeting): 2 As a passenger in a car for an hour without a break: 3 Lying down to rest in the afternoon: 1 Sitting and talking to someone: 0 Sitting quietly after lunch (no alcohol): 2 In a car, while stopped in traffic: 0 Total: 13     Objective:  Neurological Exam  Physical Exam Physical Examination:   Vitals:   09/05/18 0850  BP: (!) 165/98  Pulse: 82    General Examination: The patient is a very pleasant 53 y.o. female in no acute distress. She appears well-developed and well-nourished and well groomed.   HEENT: Normocephalic, atraumatic, pupils are equal, round and reactive to light and accommodation. Extraocular tracking is good without limitation to gaze excursion or nystagmus noted. Normal smooth pursuit is noted. Hearing is grossly intact.  Face is symmetric with normal facial animation and normal facial sensation. Speech is clear with no dysarthria noted. There is no hypophonia. There is no lip, neck/head, jaw or voice tremor. Neck is supple with full range of passive and active motion. There are no carotid bruits on auscultation. Oropharynx exam reveals: mild mouth dryness, adequate dental hygiene and  mild airway crowding, due to Small airway entry, tonsils 1+ in size.  Mallampati is class II.  She does have smooth growth over the hard palate bilaterally, fairly symmetrically.  Tongue protrudes centrally and palate elevates symmetrically.  Neck circumference is 16-1/2 inches. Nasal inspection reveals mild inferior turbinate hypertrophy and mild septal deviation to the left.  Chest: Clear to auscultation without wheezing, rhonchi or crackles noted.  Heart: S1+S2+0, regular and normal without murmurs, rubs or gallops noted.   Abdomen: Soft, non-tender and non-distended with normal  bowel sounds appreciated on auscultation.  Extremities: There is no pitting edema in the distal lower extremities bilaterally. Pedal pulses are intact.  Skin: Warm and dry without trophic changes noted. There are no varicose veins.  Musculoskeletal: exam reveals no obvious joint deformities, tenderness or joint swelling or erythema.   Neurologically:  Mental status: The patient is awake, alert and oriented in all 4 spheres. Her immediate and remote memory, attention, language skills and fund of knowledge are appropriate. There is no evidence of aphasia, agnosia, apraxia or anomia. Speech is clear with normal prosody and enunciation. Thought process is linear. Mood is normal and affect is normal.  Cranial nerves II - XII are as described above under HEENT exam. In addition: shoulder shrug is normal with equal shoulder height noted. Motor exam: Normal bulk, strength and tone is noted. There is no drift, tremor or rebound. Romberg is negative. Reflexes are 2+ throughout. Babinski: Toes are downgoing. Fine motor skills and coordination: intact with normal finger taps, normal hand movements, normal rapid alternating patting, normal foot taps and normal foot agility.  Cerebellar testing: No dysmetria or intention tremor. There is no truncal or gait ataxia.  Sensory exam: intact to light touch in the upper and lower  extremities.  Gait, station and balance: She stands easily. No veering to one side is noted. No leaning to one side is noted. Posture is age-appropriate and stance is narrow based. Gait shows normal stride length and normal pace. No problems turning are noted. Tandem walk is unremarkable.              Assessment and Plan:  In summary, Roshawna L Izola Aguilar is a very pleasant 53 y.o.-year old female with an underlying medical history of hypertension and obesity, whose history and physical exam are concerning for obstructive sleep apnea (OSA). I had a long chat with the patient about my findings and the diagnosis of OSA, its prognosis and treatment options. We talked about medical treatments, surgical interventions and non-pharmacological approaches. I explained in particular the risks and ramifications of untreated moderate to severe OSA, especially with respect to developing cardiovascular disease down the Road, including congestive heart failure, difficult to treat hypertension, cardiac arrhythmias, or stroke. Even type 2 diabetes has, in part, been linked to untreated OSA. Symptoms of untreated OSA include daytime sleepiness, memory problems, mood irritability and mood disorder such as depression and anxiety, lack of energy, as well as recurrent headaches, especially morning headaches. We talked about smoking cessation and trying to maintain a healthy lifestyle in general, as well as the importance of weight control. I encouraged the patient to eat healthy, exercise daily and keep well hydrated, to keep a scheduled bedtime and wake time routine, to not skip any meals and eat healthy snacks in between meals. I advised the patient not to drive when feeling sleepy. I recommended the following at this time: sleep study.   I explained the sleep test procedure to the patient and also outlined possible surgical and non-surgical treatment options of OSA, including the use of a custom-made dental device (which would  require a referral to a specialist dentist or oral surgeon), upper airway surgical options, such as pillar implants, radiofrequency surgery, tongue base surgery, and UPPP (which would involve a referral to an ENT surgeon). Rarely, jaw surgery such as mandibular advancement may be considered.  I also explained the CPAP treatment option to the patient, who indicated that she would be willing to try CPAP if the need arises. I explained  the importance of being compliant with PAP treatment, not only for insurance purposes but primarily to improve Her symptoms, and for the patient's long term health benefit, including to reduce Her cardiovascular risks. I answered all her questions today and the patient was in agreement. I plan to see her back after the sleep study is completed and encouraged her to call with any interim questions, concerns, problems or updates.   Thank you very much for allowing me to participate in the care of this nice patient. If I can be of any further assistance to you please do not hesitate to call me at [phone].  Sincerely,   Huston FoleySaima Mariella Blackwelder, MD, PhD

## 2018-09-17 ENCOUNTER — Encounter: Payer: Self-pay | Admitting: Nurse Practitioner

## 2018-09-17 ENCOUNTER — Ambulatory Visit: Payer: Medicaid Other | Admitting: Nurse Practitioner

## 2018-09-17 ENCOUNTER — Other Ambulatory Visit: Payer: Self-pay

## 2018-09-17 VITALS — BP 122/80 | HR 74 | Temp 98.3°F | Ht 68.2 in | Wt 220.8 lb

## 2018-09-17 DIAGNOSIS — M25512 Pain in left shoulder: Secondary | ICD-10-CM | POA: Diagnosis not present

## 2018-09-17 MED ORDER — DICLOFENAC SODIUM 1 % TD GEL: 2.0000 g | Freq: Four times a day (QID) | TRANSDERMAL | 2 refills | Status: DC

## 2018-09-17 MED ORDER — TRIAMCINOLONE ACETONIDE 40 MG/ML IJ SUSP
40.0000 mg | Freq: Once | INTRAMUSCULAR | Status: AC
  Administered 2018-09-17: 40 mg via INTRAMUSCULAR

## 2018-09-17 MED ORDER — KETOROLAC TROMETHAMINE 60 MG/2ML IM SOLN
60.0000 mg | Freq: Once | INTRAMUSCULAR | Status: AC
  Administered 2018-09-17: 60 mg via INTRAMUSCULAR

## 2018-09-17 NOTE — Progress Notes (Signed)
Subjective:     Patient ID: Makayla Aguilar , female    DOB: 1965-06-28 , 53 y.o.   MRN: [id number]   Chief Complaint  Patient presents with  . Arm Pain    patient stated her left arm has been hurting for 2 months and has recently started worsening. pt stated she can not describe the pain she just knows if she moves it it hurts    HPI  She is unable to lift her arm at the shoulder area and back of left arm.   Arm Pain  The incident occurred more than 1 week ago. There was no injury mechanism. The pain is present in the left elbow and upper left arm. The quality of the pain is described as aching. The pain does not radiate. Pertinent negatives include no chest pain. Nothing aggravates the symptoms. She has tried NSAIDs for the symptoms.     No past medical history on file.   Family History  Problem Relation Age of Onset  . Alcohol abuse Mother   . Diabetes Father      Current Outpatient Medications:  .  losartan (COZAAR) 25 MG tablet, Take 1 tablet (25 mg total) by mouth daily. (Patient not taking: Reported on 09/05/2018), Disp: 30 tablet, Rfl: 2   Allergies  Allergen Reactions  . Hydrocodone     Gets really high     Review of Systems  Respiratory: Negative.  Negative for shortness of breath.   Cardiovascular: Negative for chest pain, palpitations and leg swelling.  Musculoskeletal:       Left shoulder pain radiating up from elbow.   Neurological: Negative for dizziness and headaches.     Today's Vitals   09/17/18 1027  BP: 122/80  Pulse: 74  Temp: 98.3 F (36.8 C)  TempSrc: Oral  Weight: 220 lb 12.8 oz (100.2 kg)  Height: 5' 8.2" (1.732 m)  PainSc: 0-No pain  PainLoc: Arm   Body mass index is 33.38 kg/m.   Objective:  Physical Exam Constitutional:      Appearance: Normal appearance.  Cardiovascular:     Rate and Rhythm: Normal rate and regular rhythm.     Pulses: Normal pulses.     Heart sounds: Normal heart sounds. No murmur.  Pulmonary:     Effort:  Pulmonary effort is normal. No respiratory distress.     Breath sounds: Normal breath sounds.  Musculoskeletal:        General: Tenderness present. No swelling, deformity or signs of injury.     Right lower leg: No edema.     Left lower leg: No edema.  Skin:    General: Skin is warm and dry.     Capillary Refill: Capillary refill takes less than 2 seconds.  Neurological:     General: No focal deficit present.     Mental Status: She is alert and oriented to person, place, and time.  Psychiatric:        Mood and Affect: Mood normal.        Behavior: Behavior normal.        Thought Content: Thought content normal.        Judgment: Judgment normal.         Assessment And Plan:     1. Acute pain of left shoulder  Pain at bursa space posterior shoulder  Positive neer test however will treat with toradol and kenalog if pain persist will refer to orthopedics for further evaluation.    Able to lift  arm slightly above head before has pain - diclofenac sodium (VOLTAREN) 1 % GEL; Apply 2 g topically 4 (four) times daily.  Dispense: 100 g; Refill: 2 - ketorolac (TORADOL) injection 60 mg - triamcinolone acetonide (KENALOG-40) injection 40 mg   Arnette FeltsJanece Abass Misener, FNP    THE PATIENT IS ENCOURAGED TO PRACTICE SOCIAL DISTANCING DUE TO THE COVID-19 PANDEMIC.

## 2018-09-18 ENCOUNTER — Telehealth: Payer: Self-pay

## 2018-09-18 NOTE — Telephone Encounter (Signed)
I called pt to notify her she can get voltaren gel OTC. YRL,RMA

## 2018-09-23 ENCOUNTER — Ambulatory Visit (INDEPENDENT_AMBULATORY_CARE_PROVIDER_SITE_OTHER): Payer: Medicaid Other | Admitting: Neurology

## 2018-09-23 DIAGNOSIS — G4719 Other hypersomnia: Secondary | ICD-10-CM

## 2018-09-23 DIAGNOSIS — R519 Headache, unspecified: Secondary | ICD-10-CM

## 2018-09-23 DIAGNOSIS — G4720 Circadian rhythm sleep disorder, unspecified type: Secondary | ICD-10-CM

## 2018-09-23 DIAGNOSIS — E669 Obesity, unspecified: Secondary | ICD-10-CM

## 2018-09-23 DIAGNOSIS — R351 Nocturia: Secondary | ICD-10-CM

## 2018-09-23 DIAGNOSIS — G4733 Obstructive sleep apnea (adult) (pediatric): Secondary | ICD-10-CM

## 2018-09-23 DIAGNOSIS — R0683 Snoring: Secondary | ICD-10-CM

## 2018-09-25 ENCOUNTER — Ambulatory Visit: Payer: Medicaid Other | Admitting: Nurse Practitioner

## 2018-10-01 ENCOUNTER — Telehealth: Payer: Self-pay

## 2018-10-01 NOTE — Progress Notes (Signed)
Patient referred by Minette Brine, NP, seen by me on 09/05/18, diagnostic PSG on 09/23/18.   Please call and notify the patient that the recent sleep study showed moderate to severe obstructive sleep apnea. I recommend treatment for this in the form of CPAP. This will require a repeat sleep study for proper titration and mask fitting and correct monitoring of the oxygen saturations. Please explain to patient. I have placed an order in the chart. Thanks.  Star Age, MD, PhD [facility])

## 2018-10-01 NOTE — Procedures (Signed)
PATIENT'S NAME:  Makayla Aguilar, Makayla L. DOB:      07/22/1965      MRN:    [id number]     DATE OF RECORDING: 09/23/2018 REFERRING M.D.:  Arnette FeltsJanece Moore, FNP  Study Performed:   Baseline Polysomnogram HISTORY: 53 year old woman with a history of hypertension and obesity, who reports snoring and excessive daytime somnolence. The patient endorsed the Epworth Sleepiness Scale at 13/24 points and the FSS at   Points. The patient's weight 218 pounds with a height of 67 (inches), resulting in a BMI of 34.3 kg/m2. The patient's neck circumference measured 16.5 inches.  CURRENT MEDICATIONS: Cozaar   PROCEDURE:  This is a multichannel digital polysomnogram utilizing the Somnostar 11.2 system.  Electrodes and sensors were applied and monitored per AASM Specifications.   EEG, EOG, Chin and Limb EMG, were sampled at 200 Hz.  ECG, Snore and Nasal Pressure, Thermal Airflow, Respiratory Effort, CPAP Flow and Pressure, Oximetry was sampled at 50 Hz. Digital video and audio were recorded.      BASELINE STUDY: Lights Out was at 21:48 and Lights On at 05:00.  Total recording time (TRT) was 433 minutes, with a total sleep time (TST) of 349.5 minutes.   The patient's sleep latency was 42.5 minutes, which is delayed. REM latency was 90 minutes, which is normal. The sleep efficiency was 80.7 %.     SLEEP ARCHITECTURE: WASO (Wake after sleep onset) was 61 minutes with mild to moderate sleep fragmentation noted. There were 7.5 minutes in Stage N1, 209 minutes Stage N2, 67.5 minutes Stage N3 and 65.5 minutes in Stage REM.  The percentage of Stage N1 was 2.1%, Stage N2 was 59.8%, which is mildlyi increased, Stage N3 was 19.3%, which is normal, and Stage R (REM sleep) was 18.7%, which is near-normal.  RESPIRATORY ANALYSIS:  There were a total of 167 respiratory events:  37 obstructive apneas, 42 central apneas and 0 mixed apneas with a total of 79 apneas and an apnea index (AI) of 13.6 /hour. There were 88 hypopneas with a hypopnea index of  15.1 /hour. The patient also had 0 respiratory event related arousals (RERAs).      The total APNEA/HYPOPNEA INDEX (AHI) was 28.7/hour and the total RESPIRATORY DISTURBANCE INDEX was 28.7 /hour.  54 events occurred in REM sleep and 129 events in NREM. The REM AHI was 49.5/hour, versus a non-REM AHI of 23.9. The patient spent 194.5 minutes of total sleep time in the supine position and 155 minutes in non-supine.. The supine AHI was 35.1/h versus a non-supine AHI of 20.5/h.  OXYGEN SATURATION & C02:  The Wake baseline 02 saturation was 95%, with the lowest being 81%. Time spent below 89% saturation equaled 8 minutes.  AROUSALS:  The arousals were noted as: 104 were spontaneous, 31 were associated with PLMs, 87 were associated with respiratory events. The patient had a total of 44 Periodic Limb Movements.  The Periodic Limb Movement (PLM) index was 7.6 and the PLM Arousal index was 5.3/hour.  No significant PLMs were noted.   Audio and video analysis did not show any abnormal or unusual movements, complex behaviors, phonations or vocalizations. Nocturia once, snoring was moderate to loud. The EKG in normal sinus rhythm (NSR).  IMPRESSION:  1. Obstructive Sleep Apnea(OSA) 2. Dysfunctions associated with sleep stages or arousals from sleep   RECOMMENDATIONS:  1. This study demonstrates moderate to severe obstructive sleep apnea, with a total AHI of 28.7/hour, REM AHI of 49.5/hour, supine AHI of 35.1/hour and O2 nadir  of 81%. Treatment with positive airway pressure in the form of CPAP is recommended. This will require a full night titration study to optimize therapy. Other treatment options may include - generally speaking - avoidance of supine sleep position along with weight loss, upper airway or jaw surgery in selected patients or the use of an oral appliance in certain patients. ENT evaluation and/or consultation with a maxillofacial surgeon or dentist may be feasible in some instances.     2. Please note that untreated obstructive sleep apnea may carry additional perioperative morbidity. Patients with significant obstructive sleep apnea should receive perioperative PAP therapy and the surgeons and particularly the anesthesiologist should be informed of the diagnosis and the severity of the sleep disordered breathing. 3. This study shows sleep fragmentation and abnormal sleep stage percentages; these are nonspecific findings and per se do not signify an intrinsic sleep disorder or a cause for the patient's sleep-related symptoms. Causes include (but are not limited to) the first night effect of the sleep study, circadian rhythm disturbances, medication effect or an underlying mood disorder or medical problem.  4. The patient should be cautioned not to drive, work at heights, or operate dangerous or heavy equipment when tired or sleepy. Review and reiteration of good sleep hygiene measures should be pursued with any patient. 5. The patient will be seen in follow-up in the sleep clinic at [facility] for discussion of the test results, symptom and treatment compliance review, further management strategies, etc. The referring provider will be notified of the test results.  I certify that I have reviewed the entire raw data recording prior to the issuance of this report in accordance with the Standards of Accreditation of the American Academy of Sleep Medicine (AASM)     Star Age, MD, PhD Diplomat, American Board of Neurology and Sleep Medicine ( Neurology and Sleep Medicine)

## 2018-10-01 NOTE — Telephone Encounter (Signed)
I called pt to discuss her sleep study results. No answer, left a message asking her to call me back. 

## 2018-10-01 NOTE — Telephone Encounter (Signed)
-----   Message from Star Age, MD sent at 10/01/2018  8:29 AM EDT ----- Patient referred by Minette Brine, NP, seen by me on 09/05/18, diagnostic PSG on 09/23/18.   Please call and notify the patient that the recent sleep study showed moderate to severe obstructive sleep apnea. I recommend treatment for this in the form of CPAP. This will require a repeat sleep study for proper titration and mask fitting and correct monitoring of the oxygen saturations. Please explain to patient. I have placed an order in the chart. Thanks.  Star Age, MD, PhD [facility])

## 2018-10-01 NOTE — Addendum Note (Signed)
Addended by: Star Age on: 10/01/2018 08:29 AM   Modules accepted: Orders

## 2018-10-02 ENCOUNTER — Other Ambulatory Visit: Payer: Self-pay

## 2018-10-02 ENCOUNTER — Encounter: Payer: Medicaid Other | Attending: Nurse Practitioner | Admitting: Registered"

## 2018-10-02 ENCOUNTER — Encounter: Payer: Self-pay | Admitting: Registered"

## 2018-10-02 DIAGNOSIS — I10 Essential (primary) hypertension: Secondary | ICD-10-CM

## 2018-10-02 NOTE — Progress Notes (Signed)
  Medical Nutrition Therapy:  Appt start time: 9:25 end time:  10:30.   Assessment:  Primary concerns today: Pt arrives she has had hypertension for at least 10 years or more.    States she was so sick and wants to eat better. Since then has been trying veganism but still hungry all the time, started about a month ago. States daughter showed her documentary "What the Health" documentary which opened her mind to veganism. States she was exercising, walking 3 mi 4-5x/week. States she is still trying to figure out what to eat. Reports she was not eating crackers due to them being carbohydrates.   Preferred Learning Style:   No preference indicated   Learning Readiness:   Ready  Change in progress   MEDICATIONS: See list   DIETARY INTAKE:  Usual eating pattern includes 2 meals and 1 snacks per day.  Everyday foods include cereal, meat alternatives, smoothies, protein powder, nuts, soda, water, pizza, ribs, salad, potatoes, and rolls.  Avoided foods include none stated.    24-hr recall:  B ( AM): raisin bran  Snk ( AM): none  L ( PM): skipped Snk ( PM): raisins + peanuts + water + vegetable soup D ( PM): Longhorn-ribs + salad + baked potato (with sour cream, butter, onion) + roll + mountain dew or 4 slices of cheese pizza Snk ( PM): none Beverages: soda, water, green tea, soy milk  Usual physical activity: walking 3 mi, 4-5x/week  Estimated energy needs: 1600-1800 calories 180-200 g carbohydrates 120-135 g protein 44-50 g fat  Progress Towards Goal(s):  In progress.   Nutritional Diagnosis:  NB-1.1 Food and nutrition-related knowledge deficit As related to hypertension.  As evidenced by pt verablizes inaccurate information.    Intervention:  Nutrition education and counseling. Pt was educated and counseled on nutrition related to hypertension, DASH plan, ways to increase fiber intake, ways to have balanced meals without being completely vegan, and ways to season food using  less salt. Pt was educated on sodium recommendations and sodium content in food. Pt was in agreement with goals listed.  Goals: - Try not to skip lunch.  - Increase fiber intake with whole grains, fruits, and vegetables. Having them  with each meal.  - Use handout as guide for meal ideas.  Teaching Method Utilized:  Visual Auditory Hands on  Handouts given during visit include:  Hypertension Nutrition Therapy  Barriers to learning/adherence to lifestyle change: contemplative stage of change  Demonstrated degree of understanding via:  Teach Back   Monitoring/Evaluation:  Dietary intake, exercise, and body weight prn.

## 2018-10-02 NOTE — Patient Instructions (Addendum)
-   Try not to skip lunch.   - Increase fiber intake with whole grains, fruits, and vegetables. Having them  with each meal.   - Use handout as guide for meal ideas.

## 2018-10-02 NOTE — Telephone Encounter (Signed)

## 2018-10-17 ENCOUNTER — Other Ambulatory Visit (HOSPITAL_COMMUNITY)
Admission: RE | Admit: 2018-10-17 | Discharge: 2018-10-17 | Disposition: A | Discharge status: Home / Self Care | Payer: Medicaid Other | Source: Ambulatory Visit | Attending: Cardiology | Admitting: Cardiology

## 2018-10-17 DIAGNOSIS — Z20828 Contact with and (suspected) exposure to other viral communicable diseases: Secondary | ICD-10-CM | POA: Diagnosis not present

## 2018-10-17 DIAGNOSIS — Z01812 Encounter for preprocedural laboratory examination: Secondary | ICD-10-CM | POA: Diagnosis not present

## 2018-10-18 LAB — NOVEL CORONAVIRUS, NAA (HOSP ORDER, SEND-OUT TO REF LAB; TAT 18-24 HRS): SARS-CoV-2, NAA: NOT DETECTED

## 2018-10-21 ENCOUNTER — Ambulatory Visit (INDEPENDENT_AMBULATORY_CARE_PROVIDER_SITE_OTHER): Payer: Medicaid Other | Admitting: Neurology

## 2018-10-21 DIAGNOSIS — E669 Obesity, unspecified: Secondary | ICD-10-CM

## 2018-10-21 DIAGNOSIS — G4733 Obstructive sleep apnea (adult) (pediatric): Secondary | ICD-10-CM | POA: Diagnosis not present

## 2018-10-21 DIAGNOSIS — R351 Nocturia: Secondary | ICD-10-CM

## 2018-10-21 DIAGNOSIS — R519 Headache, unspecified: Secondary | ICD-10-CM

## 2018-10-21 DIAGNOSIS — G4720 Circadian rhythm sleep disorder, unspecified type: Secondary | ICD-10-CM

## 2018-10-21 DIAGNOSIS — G4719 Other hypersomnia: Secondary | ICD-10-CM

## 2018-10-29 ENCOUNTER — Telehealth: Payer: Self-pay

## 2018-10-29 NOTE — Progress Notes (Signed)
Patient referred by Minette Brine, NP, seen by me on 09/05/18, diagnostic PSG on 09/23/18. Patient had a CPAP titration study on 10/21/18.  Please call and inform patient that I have entered an order for treatment with positive airway pressure (PAP) treatment for obstructive sleep apnea (OSA). She did well during the latest sleep study with CPAP. We will, therefore, arrange for a machine for home use through a DME (durable medical equipment) company of Her choice; and I will see the patient back in follow-up in about 10 weeks. Please also explain to the patient that I will be looking out for compliance data, which can be downloaded from the machine (stored on an SD card, that is inserted in the machine) or via remote access through a modem, that is built into the machine. At the time of the followup appointment we will discuss sleep study results and how it is going with PAP treatment at home. Please advise patient to bring Her machine at the time of the first FU visit, even though this is cumbersome. Bringing the machine for every visit after that will likely not be needed, but often helps for the first visit to troubleshoot if needed. Please re-enforce the importance of compliance with treatment and the need for [place] to monitor compliance data - often an insurance requirement and actually good feedback for the patient as far as how they are doing.  Also remind patient, that any interim PAP machine or mask issues should be first addressed with the DME company, as they can often help better with technical and mask fit issues. Please ask if patient has a preference regarding DME company.  Please also make sure, the patient has a follow-up appointment with me in about 10 weeks from the setup date, thanks. May see one of our nurse practitioners if needed for proper timing of the FU appointment.  Please fax or rout report to the referring provider. Thanks,   Star Age, MD, PhD [facility])

## 2018-10-29 NOTE — Procedures (Signed)
PATIENT'S NAME:  Makayla Aguilar, Makayla Aguilar DOB:      06/13/1965      MR#:    [id number]     DATE OF RECORDING: 10/21/2018 REFERRING M.D.:  Minette Brine, FNP Study Performed:   CPAP  Titration HISTORY: 53 year old woman with a history of hypertension and obesity, who returns for a titration study. Her baseline sleep study on 09/23/18 showed moderate to severe obstructive sleep apnea, with a total AHI of 28.7/hour, REM AHI of 49.5/hour, supine AHI of 35.1/hour and O2 nadir of 81%. The patient endorsed the Epworth Sleepiness Scale at 13/24 points. The patient's weight 218 pounds with a height of 67 (inches), resulting in a BMI of 34.3 kg/m2. The patient's neck circumference measured 16.5 inches.  CURRENT MEDICATIONS: Cozaar   PROCEDURE:  This is a multichannel digital polysomnogram utilizing the SomnoStar 11.2 system.  Electrodes and sensors were applied and monitored per AASM Specifications.   EEG, EOG, Chin and Limb EMG, were sampled at 200 Hz.  ECG, Snore and Nasal Pressure, Thermal Airflow, Respiratory Effort, CPAP Flow and Pressure, Oximetry was sampled at 50 Hz. Digital video and audio were recorded.      The patient was fitted with a medium Dreamwear FFM. CPAP was initiated at 5 cmH20 with heated humidity per AASM standards and pressure was advanced to 9 cmH20 because of hypopneas, apneas and desaturations.  At a PAP pressure of 7 cmH20, there was a reduction of the AHI to 2.4 with supine REM sleep achieved and O2 nadir of 93%.   Lights Out was at 20:57 and Lights On at 04:57. Total recording time (TRT) was 481 minutes, with a total sleep time (TST) of 396.5 minutes. The patient's sleep latency was 42.5 minutes. REM latency was 87 minutes.  The sleep efficiency was 82.4 %.    SLEEP ARCHITECTURE: WASO (Wake after sleep onset) was 55.5 minutes with mild sleep fragmentation noted. There were 1.5 minutes in Stage N1, 221.5 minutes Stage N2, 73 minutes Stage N3 and 100.5 minutes in Stage REM.  The percentage of  Stage N1 was .4%, Stage N2 was 55.9%, Stage N3 was 18.4% and Stage R (REM sleep) was 25.3%. The sleep architecture was normal as far as sleep stage percentages. The arousals were noted as: 36 were spontaneous, 0 were associated with PLMs, 0 were associated with respiratory events.  RESPIRATORY ANALYSIS:  There was a total of 8 respiratory events: 1 obstructive apneas, 7 central apneas and 0 mixed apneas with a total of 8 apneas and an apnea index (AI) of 1.2 /hour. There were 0 hypopneas with a hypopnea index of 0/hour. The patient also had 0 respiratory event related arousals (RERAs).      The total APNEA/HYPOPNEA INDEX  (AHI) was 1.2 /hour and the total RESPIRATORY DISTURBANCE INDEX was 1.2 /hour  3 events occurred in REM sleep and 5 events in NREM. The REM AHI was 1.8 /hour versus a non-REM AHI of 1. /hour.  The patient spent 218 minutes of total sleep time in the supine position and 179 minutes in non-supine. The supine AHI was 1.4, versus a non-supine AHI of 1.0.  OXYGEN SATURATION & C02:  The baseline 02 saturation was 98%, with the lowest being 92%. Time spent below 89% saturation equaled 0 minutes.  PERIODIC LIMB MOVEMENTS:  The patient had a total of 0 Periodic Limb Movements. The Periodic Limb Movement (PLM) index was 0 and the PLM Arousal index was 0 /hour.  Audio and video analysis did not show  any abnormal or unusual movements, behaviors, phonations or vocalizations. The patient took 1 bathroom break. The EKG was in keeping with normal sinus rhythm.  Post-study, the patient indicated that sleep was better than usual.   IMPRESSION:   1. Obstructive Sleep Apnea(OSA)   RECOMMENDATIONS:   1. This study demonstrates near-complete resolution of the patient's obstructive sleep apnea with CPAP therapy. Given the AHI of 2.4/hour on a pressure of 7 cm, I will recommend a home CPAP treatment pressure of 8 cm via medium hybrid FFM with heated humidity. The patient should be reminded to be fully  compliant with PAP therapy to improve sleep related symptoms and decrease long term cardiovascular risks. The patient should be reminded, that it may take up to 3 months to get fully used to using PAP with all planned sleep. The earlier full compliance is achieved, the better long term compliance tends to be. Please note that untreated obstructive sleep apnea may carry additional perioperative morbidity. Patients with significant obstructive sleep apnea should receive perioperative PAP therapy and the surgeons and particularly the anesthesiologist should be informed of the diagnosis and the severity of the sleep disordered breathing. 2. The patient should be cautioned not to drive, work at heights, or operate dangerous or heavy equipment when tired or sleepy. Review and reiteration of good sleep hygiene measures should be pursued with any patient. 3. The patient will be seen in follow-up in the sleep clinic at [facility] for discussion of the test results, symptom and treatment compliance review, further management strategies, etc. The referring provider will be notified of the test results.   I certify that I have reviewed the entire raw data recording prior to the issuance of this report in accordance with the Standards of Accreditation of the American Academy of Sleep Medicine (AASM)         Huston Foley, MD, PhD Diplomat, American Board of Neurology and Sleep Medicine (Neurology and Sleep Medicine)

## 2018-10-29 NOTE — Telephone Encounter (Addendum)
I called pt. I advised pt that Dr. Rexene Alberts reviewed their sleep study results and found that pt did well with her cpap during her latest sleep study. Dr. Rexene Alberts recommends that pt start a cpap. I reviewed PAP compliance expectations with the pt. Pt is agreeable to starting a CPAP. I advised pt that an order will be sent to a DME, [facility], and [facility] will call the pt within about one week after they file with the pt's insurance. [facility] will show the pt how to use the machine, fit for masks, and troubleshoot the CPAP if needed. A follow up appt was made for insurance purposes with Amy, NP on 01/16/19 at 9:30am. Pt verbalized understanding to arrive 15 minutes early and bring their CPAP. A letter with all of this information in it will be mailed to the pt as a reminder. I verified with the pt that the address we have on file is correct. Pt verbalized understanding of results. Pt had no questions at this time but was encouraged to call back if questions arise. I have sent the order to [facility] and have received confirmation that they have received the order.

## 2018-10-29 NOTE — Addendum Note (Signed)
Addended by: Star Age on: 10/29/2018 08:43 AM   Modules accepted: Orders

## 2018-10-29 NOTE — Telephone Encounter (Signed)
-----   Message from Star Age, MD sent at 10/29/2018  8:43 AM EDT ----- Patient referred by Minette Brine, NP, seen by me on 09/05/18, diagnostic PSG on 09/23/18. Patient had a CPAP titration study on 10/21/18.  Please call and inform patient that I have entered an order for treatment with positive airway pressure (PAP) treatment for obstructive sleep apnea (OSA). She did well during the latest sleep study with CPAP. We will, therefore, arrange for a machine for home use through a DME (durable medical equipment) company of Her choice; and I will see the patient back in follow-up in about 10 weeks. Please also explain to the patient that I will be looking out for compliance data, which can be downloaded from the machine (stored on an SD card, that is inserted in the machine) or via remote access through a modem, that is built into the machine. At the time of the followup appointment we will discuss sleep study results and how it is going with PAP treatment at home. Please advise patient to bring Her machine at the time of the first FU visit, even though this is cumbersome. Bringing the machine for every visit after that will likely not be needed, but often helps for the first visit to troubleshoot if needed. Please re-enforce the importance of compliance with treatment and the need for [place] to monitor compliance data - often an insurance requirement and actually good feedback for the patient as far as how they are doing.  Also remind patient, that any interim PAP machine or mask issues should be first addressed with the DME company, as they can often help better with technical and mask fit issues. Please ask if patient has a preference regarding DME company.  Please also make sure, the patient has a follow-up appointment with me in about 10 weeks from the setup date, thanks. May see one of our nurse practitioners if needed for proper timing of the FU appointment.  Please fax or rout report to the referring provider.  Thanks,   Star Age, MD, PhD [facility])

## 2018-11-10 DIAGNOSIS — H40033 Anatomical narrow angle, bilateral: Secondary | ICD-10-CM | POA: Diagnosis not present

## 2018-11-10 DIAGNOSIS — H2513 Age-related nuclear cataract, bilateral: Secondary | ICD-10-CM | POA: Diagnosis not present

## 2018-11-21 DIAGNOSIS — G4733 Obstructive sleep apnea (adult) (pediatric): Secondary | ICD-10-CM | POA: Diagnosis not present

## 2018-11-29 DIAGNOSIS — H04123 Dry eye syndrome of bilateral lacrimal glands: Secondary | ICD-10-CM | POA: Diagnosis not present

## 2018-11-30 DIAGNOSIS — H5213 Myopia, bilateral: Secondary | ICD-10-CM | POA: Diagnosis not present

## 2018-12-02 DIAGNOSIS — G4733 Obstructive sleep apnea (adult) (pediatric): Secondary | ICD-10-CM | POA: Diagnosis not present

## 2018-12-04 DIAGNOSIS — G4733 Obstructive sleep apnea (adult) (pediatric): Secondary | ICD-10-CM | POA: Diagnosis not present

## 2018-12-06 ENCOUNTER — Telehealth: Payer: Self-pay | Admitting: Neurology

## 2018-12-06 NOTE — Telephone Encounter (Signed)
I called pt back stating  [employer] call her for supplies and stating she was having events. The pt stated she was only told the events were happening regularly. The pt stated they were not specific if her pressure on the machine needed to be adjusted or her breathing was different.I advise pt to have adapt to give our office a call to get more clarification. The pt stated she has had the cipap for only two weeks. PT verbalized understanding.

## 2018-12-06 NOTE — Telephone Encounter (Signed)
Pt called stating that the company that provided the cpap machine for her told her that she is having to many events and she is to reach out to her provider to discuss this. Please advise.

## 2018-12-25 DIAGNOSIS — H1013 Acute atopic conjunctivitis, bilateral: Secondary | ICD-10-CM | POA: Diagnosis not present

## 2019-01-01 DIAGNOSIS — G4733 Obstructive sleep apnea (adult) (pediatric): Secondary | ICD-10-CM | POA: Diagnosis not present

## 2019-01-15 ENCOUNTER — Telehealth: Payer: Self-pay

## 2019-01-16 ENCOUNTER — Ambulatory Visit: Payer: Medicaid Other | Admitting: Family Medicine

## 2019-01-16 ENCOUNTER — Other Ambulatory Visit: Payer: Self-pay

## 2019-01-16 ENCOUNTER — Encounter: Payer: Self-pay | Admitting: Family Medicine

## 2019-01-16 VITALS — BP 131/83 | HR 82 | Temp 97.1°F | Ht 67.0 in | Wt 236.6 lb

## 2019-01-16 DIAGNOSIS — Z9989 Dependence on other enabling machines and devices: Secondary | ICD-10-CM

## 2019-01-16 DIAGNOSIS — G4733 Obstructive sleep apnea (adult) (pediatric): Secondary | ICD-10-CM

## 2019-01-16 NOTE — Progress Notes (Signed)
PATIENT: Makayla Aguilar DOB: 01-Aug-1965  REASON FOR VISIT: follow up HISTORY FROM: patient  Chief Complaint  Patient presents with  . Follow-up    room 2, alone. CPAP works well. Wants to know what is a good level.      HISTORY OF PRESENT ILLNESS: Today 01/16/19 Makayla Aguilar is a 53 y.o. female here today for follow up of OSA on CPAP.  She reports that she is doing very well with CPAP therapy.  She is using CPAP nightly without difficulty.  She reports significant improvement and fatigue levels.  No longer having headaches.  She has no concerns today.  Compliance report dated 12/16/2018 through 01/14/2019 reveals that she is using CPAP every night for compliance of 100%.  She used CPAP greater than 4 hours every night for compliance of 100%.  Average usage was 6 hours and 48 minutes.  Residual AHI was 10.2 on 8 cm of water and an EPR of 3.  1.6 central events noted and 6.2 obstructive.  There was no significant leak.  HISTORY: (copied from Dr Guadelupe Sabin note on 09/05/2018)  Dear Makayla Aguilar,   I saw your patient, Makayla Aguilar, upon your kind request in my sleep clinic today for initial consultation of her sleep disorder, in particular, concern for underlying obstructive sleep apnea.  The patient is unaccompanied today.  As you know, Makayla Aguilar is a 53 year old right-handed woman with an underlying medical history of hypertension and obesity, who reports snoring and excessive daytime somnolence.  She has had some recurrent frontal headaches for which she saw ENT.  She was given a nasal spray.  Her Epworth sleepiness score is 13 out of 24 today, fatigue severity score is 63 out of 63.  I reviewed your office visit note from 08/27/2018.  She reports difficulty initiating and maintaining sleep.  She does not typically take any medication for this.  She has a bedtime of around 8 PM but it is not unusual for her to fall asleep after a delay of almost 3 hours.  She does not watch TV or use her phone in  the bedroom.  She lives with her 65 year old son who knows to be quiet after 8 PM.  She has no pets in the household.  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  She is trying to quit smoking, currently smokes at least 10/day but has plans to not buy any more cigarettes, she is on her last pack.  She does not drink alcohol and caffeine and limitation, soda for lunch but otherwise no coffee or tea, she has increased her water intake.  She has nocturia about 2-3 times per average night.  She has woken up with a dull and achy headache and sometimes a migraine-like headache.  She had intermittent migraines in the past.  She was told that she had a deviated septum but no surgery was recommended.  She had seen Dr. Wilburn Cornelia.  She has tried a nasal spray which did not help very much.  She is trying to lose weight.  Her rise time varies, it is not unusual for her to wake up around 4.  She tries to walk in the mornings when it is not so hot.   REVIEW OF SYSTEMS: Out of a complete 14 system review of symptoms, the patient complains only of the following symptoms, none and all other reviewed systems are negative.  Epworth sleepiness scale: 3 Fatigue severity scale:  38  ALLERGIES: Allergies  Allergen Reactions  . Hydrocodone     Gets really high    HOME MEDICATIONS: Outpatient Medications Prior to Visit  Medication Sig Dispense Refill  . Acetaminophen (TYLENOL 8 HOUR PO) Take by mouth 2 (two) times daily as needed. OTC PRN (generic version)    . diclofenac sodium (VOLTAREN) 1 % GEL Apply 2 g topically 4 (four) times daily. 100 g 2  . losartan (COZAAR) 25 MG tablet Take 1 tablet (25 mg total) by mouth daily. (Patient not taking: Reported on 09/05/2018) 30 tablet 2  . vitamin B-12 (CYANOCOBALAMIN) 100 MCG tablet Take 100 mcg by mouth daily.     No facility-administered medications prior to visit.    PAST MEDICAL HISTORY: Past Medical History:    Diagnosis Date  . Hypertension     PAST SURGICAL HISTORY: Past Surgical History:  Procedure Laterality Date  . ABDOMINAL HYSTERECTOMY      FAMILY HISTORY: Family History  Problem Relation Age of Onset  . Alcohol abuse Mother   . Diabetes Father     SOCIAL HISTORY: Social History   Socioeconomic History  . Marital status: Single    Spouse name: Not on file  . Number of children: Not on file  . Years of education: Not on file  . Highest education level: Not on file  Occupational History  . Not on file  Tobacco Use  . Smoking status: Current Every Day Smoker    Packs/day: 0.50    Types: Cigarettes  . Smokeless tobacco: Never Used  Substance and Sexual Activity  . Alcohol use: Not Currently  . Drug use: Not Currently  . Sexual activity: Not on file  Other Topics Concern  . Not on file  Social History Narrative  . Not on file   Social Determinants of Health   Financial Resource Strain:   . Difficulty of Paying Living Expenses: Not on file  Food Insecurity:   . Worried About Programme researcher, broadcasting/film/video in the Last Year: Not on file  . Ran Out of Food in the Last Year: Not on file  Transportation Needs:   . Lack of Transportation (Medical): Not on file  . Lack of Transportation (Non-Medical): Not on file  Physical Activity:   . Days of Exercise per Week: Not on file  . Minutes of Exercise per Session: Not on file  Stress:   . Feeling of Stress : Not on file  Social Connections:   . Frequency of Communication with Friends and Family: Not on file  . Frequency of Social Gatherings with Friends and Family: Not on file  . Attends Religious Services: Not on file  . Active Member of Clubs or Organizations: Not on file  . Attends Banker Meetings: Not on file  . Marital Status: Not on file  Intimate Partner Violence:   . Fear of Current or Ex-Partner: Not on file  . Emotionally Abused: Not on file  . Physically Abused: Not on file  . Sexually Abused: Not on  file      PHYSICAL EXAM  Vitals:   01/16/19 1026  BP: 131/83  Pulse: 82  Temp: (!) 97.1 F (36.2 C)  Weight: 236 lb 9.6 oz (107.3 kg)  Height: 5\' 7"  (1.702 m)   Body mass index is 37.06 kg/m.  Generalized: Well developed, in no acute distress  Cardiology: normal rate and rhythm, no murmur noted Respiratory: Clear to auscultation bilaterally Neurological examination  Mentation: Alert oriented to time,  place, history taking. Follows all commands speech and language fluent Cranial nerve II-XII: Pupils were equal round reactive to light. Extraocular movements were full, visual field were full on confrontational test.  Motor: The motor testing reveals 5 over 5 strength of all 4 extremities. Good symmetric motor tone is noted throughout.  Gait and station: Gait is normal.  DIAGNOSTIC DATA (LABS, IMAGING, TESTING) - I reviewed patient records, labs, notes, testing and imaging myself where available.  No flowsheet data found.   Lab Results  Component Value Date   WBC 9.0 02/23/2018   HGB 13.0 02/23/2018   HCT 39.3 02/23/2018   MCV 92 02/23/2018   PLT 366 02/23/2018      Component Value Date/Time   NA 143 02/23/2018 1001   K 4.1 02/23/2018 1001   CL 103 02/23/2018 1001   CO2 23 02/23/2018 1001   GLUCOSE 80 02/23/2018 1001   GLUCOSE 102 (H) 02/17/2018 1000   BUN 10 02/23/2018 1001   CREATININE 0.83 02/23/2018 1001   CALCIUM 9.6 02/23/2018 1001   PROT 6.8 02/23/2018 1001   ALBUMIN 4.2 02/23/2018 1001   AST 14 02/23/2018 1001   ALT 14 02/23/2018 1001   ALKPHOS 79 02/23/2018 1001   BILITOT 0.4 02/23/2018 1001   GFRNONAA 81 02/23/2018 1001   GFRAA 93 02/23/2018 1001   Lab Results  Component Value Date   CHOL 215 (H) 02/23/2018   HDL 62 02/23/2018   LDLCALC 134 (H) 02/23/2018   TRIG 97 02/23/2018   CHOLHDL 3.5 02/23/2018   Lab Results  Component Value Date   HGBA1C 5.1 02/19/2018   Lab Results  Component Value Date   VITAMINB12 275 08/27/2018   Lab  Results  Component Value Date   TSH 0.507 02/19/2018     ASSESSMENT AND PLAN 53 y.o. year old female  has a past medical history of Hypertension. here with     ICD-10-CM   1. OSA on CPAP  G47.33    Z99.89     Mrs. Izola PriceMyers is doing very well with CPAP therapy.  She notes significant improvement in daytime sleepiness as well as resolved headaches.  She is tolerating CPAP well without concerns.  Unfortunately, AHI remains elevated.  I will increase pressure to 9 cm of water. She was encouraged to continue using CPAP nightly and for greater than 4 hours each night.  We will follow-up in 6 weeks to assess response.  She verbalizes understanding and agreement with this plan.   No orders of the defined types were placed in this encounter.    No orders of the defined types were placed in this encounter.     I spent 15 minutes with the patient. 50% of this time was spent counseling and educating patient on plan of care and medications.    Shawnie Dappermy Peg Fifer, FNP-C 01/16/2019, 11:51 AM [facility] [address], [address] [place], [place] [address] [phone]

## 2019-01-16 NOTE — Telephone Encounter (Signed)
Called with the assistance of a coworker (CMA [place] D.) to r/s pt due to the weather.

## 2019-01-16 NOTE — Patient Instructions (Addendum)
Continue CPAP nightly and for greater than 4 hours each night   We will increase pressure to 9cmH20. DME will do this for you.   Follow up in 6 weeks [facility]) to review compliance report.   CPAP and BPAP Information CPAP and BPAP are methods of helping a person breathe with the use of air pressure. CPAP stands for "continuous positive airway pressure." BPAP stands for "bi-level positive airway pressure." In both methods, air is blown through your nose or mouth and into your air passages to help you breathe well. CPAP and BPAP use different amounts of pressure to blow air. With CPAP, the amount of pressure stays the same while you breathe in and out. With BPAP, the amount of pressure is increased when you breathe in (inhale) so that you can take larger breaths. Your health care provider will recommend whether CPAP or BPAP would be more helpful for you. Why are CPAP and BPAP treatments used? CPAP or BPAP can be helpful if you have:  Sleep apnea.  Chronic obstructive pulmonary disease (COPD).  Heart failure.  Medical conditions that weaken the muscles of the chest including muscular dystrophy, or neurological diseases such as amyotrophic lateral sclerosis (ALS).  Other problems that cause breathing to be weak, abnormal, or difficult. CPAP is most commonly used for obstructive sleep apnea (OSA) to keep the airways from collapsing when the muscles relax during sleep. How is CPAP or BPAP administered? Both CPAP and BPAP are provided by a small machine with a flexible plastic tube that attaches to a plastic mask. You wear the mask. Air is blown through the mask into your nose or mouth. The amount of pressure that is used to blow the air can be adjusted on the machine. Your health care provider will determine the pressure setting that should be used based on your individual needs. When should CPAP or BPAP be used? In most cases, the mask only needs to be worn during sleep. Generally, the mask needs  to be worn throughout the night and during any daytime naps. People with certain medical conditions may also need to wear the mask at other times when they are awake. Follow instructions from your health care provider about when to use the machine. What are some tips for using the mask?   Because the mask needs to be snug, some people feel trapped or closed-in (claustrophobic) when first using the mask. If you feel this way, you may need to get used to the mask. One way to do this is by holding the mask loosely over your nose or mouth and then gradually applying the mask more snugly. You can also gradually increase the amount of time that you use the mask.  Masks are available in various types and sizes. Some fit over your mouth and nose while others fit over just your nose. If your mask does not fit well, talk with your health care provider about getting a different one.  If you are using a mask that fits over your nose and you tend to breathe through your mouth, a chin strap may be applied to help keep your mouth closed.  The CPAP and BPAP machines have alarms that may sound if the mask comes off or develops a leak.  If you have trouble with the mask, it is very important that you talk with your health care provider about finding a way to make the mask easier to tolerate. Do not stop using the mask. Stopping the use of the  mask could have a negative impact on your health. What are some tips for using the machine?  Place your CPAP or BPAP machine on a secure table or stand near an electrical outlet.  Know where the on/off switch is located on the machine.  Follow instructions from your health care provider about how to set the pressure on your machine and when you should use it.  Do not eat or drink while the CPAP or BPAP machine is on. Food or fluids could get pushed into your lungs by the pressure of the CPAP or BPAP.  Do not smoke. Tobacco smoke residue can damage the machine.  For home  use, CPAP and BPAP machines can be rented or purchased through home health care companies. Many different brands of machines are available. Renting a machine before purchasing may help you find out which particular machine works well for you.  Keep the CPAP or BPAP machine and attachments clean. Ask your health care provider for specific instructions. Get help right away if:  You have redness or open areas around your nose or mouth where the mask fits.  You have trouble using the CPAP or BPAP machine.  You cannot tolerate wearing the CPAP or BPAP mask.  You have pain, discomfort, and bloating in your abdomen. Summary  CPAP and BPAP are methods of helping a person breathe with the use of air pressure.  Both CPAP and BPAP are provided by a small machine with a flexible plastic tube that attaches to a plastic mask.  If you have trouble with the mask, it is very important that you talk with your health care provider about finding a way to make the mask easier to tolerate. This information is not intended to replace advice given to you by your health care provider. Make sure you discuss any questions you have with your health care provider. Document Released: 10/16/2003 Document Revised: 05/09/2018 Document Reviewed: 12/07/2015 Elsevier Patient Education  2020 [employer] Inc[employer]   Sleep Apnea Sleep apnea affects breathing during sleep. It causes breathing to stop for a short time or to become shallow. It can also increase the risk of:  Heart attack.  Stroke.  Being very overweight (obese).  Diabetes.  Heart failure.  Irregular heartbeat. The goal of treatment is to help you breathe normally again. What are the causes? There are three kinds of sleep apnea:  Obstructive sleep apnea. This is caused by a blocked or collapsed airway.  Central sleep apnea. This happens when the brain does not send the right signals to the muscles that control breathing.  Mixed sleep apnea. This is a  combination of obstructive and central sleep apnea. The most common cause of this condition is a collapsed or blocked airway. This can happen if:  Your throat muscles are too relaxed.  Your tongue and tonsils are too large.  You are overweight.  Your airway is too small. What increases the risk?  Being overweight.  Smoking.  Having a small airway.  Being older.  Being female.  Drinking alcohol.  Taking medicines to calm yourself (sedatives or tranquilizers).  Having family members with the condition. What are the signs or symptoms?  Trouble staying asleep.  Being sleepy or tired during the day.  Getting angry a lot.  Loud snoring.  Headaches in the morning.  Not being able to focus your mind (concentrate).  Forgetting things.  Less interest in sex.  Mood swings.  Personality changes.  Feelings of sadness (depression).  Waking up  a lot during the night to pee (urinate).  Dry mouth.  Sore throat. How is this diagnosed?  Your medical history.  A physical exam.  A test that is done when you are sleeping (sleep study). The test is most often done in a sleep lab but may also be done at home. How is this treated?   Sleeping on your side.  Using a medicine to get rid of mucus in your nose (decongestant).  Avoiding the use of alcohol, medicines to help you relax, or certain pain medicines (narcotics).  Losing weight, if needed.  Changing your diet.  Not smoking.  Using a machine to open your airway while you sleep, such as: ? An oral appliance. This is a mouthpiece that shifts your lower jaw forward. ? A CPAP device. This device blows air through a mask when you breathe out (exhale). ? An EPAP device. This has valves that you put in each nostril. ? A BPAP device. This device blows air through a mask when you breathe in (inhale) and breathe out.  Having surgery if other treatments do not work. It is important to get treatment for sleep apnea.  Without treatment, it can lead to:  High blood pressure.  Coronary artery disease.  In men, not being able to have an erection (impotence).  Reduced thinking ability. Follow these instructions at home: Lifestyle  Make changes that your doctor recommends.  Eat a healthy diet.  Lose weight if needed.  Avoid alcohol, medicines to help you relax, and some pain medicines.  Do not use any products that contain nicotine or tobacco, such as cigarettes, e-cigarettes, and chewing tobacco. If you need help quitting, ask your doctor. General instructions  Take over-the-counter and prescription medicines only as told by your doctor.  If you were given a machine to use while you sleep, use it only as told by your doctor.  If you are having surgery, make sure to tell your doctor you have sleep apnea. You may need to bring your device with you.  Keep all follow-up visits as told by your doctor. This is important. Contact a doctor if:  The machine that you were given to use during sleep bothers you or does not seem to be working.  You do not get better.  You get worse. Get help right away if:  Your chest hurts.  You have trouble breathing in enough air.  You have an uncomfortable feeling in your back, arms, or stomach.  You have trouble talking.  One side of your body feels weak.  A part of your face is hanging down. These symptoms may be an emergency. Do not wait to see if the symptoms will go away. Get medical help right away. Call your local emergency services (911 in the [place] Do not drive yourself to the hospital. Summary  This condition affects breathing during sleep.  The most common cause is a collapsed or blocked airway.  The goal of treatment is to help you breathe normally while you sleep. This information is not intended to replace advice given to you by your health care provider. Make sure you discuss any questions you have with your health care  provider. Document Released: 10/27/2007 Document Revised: 11/03/2017 Document Reviewed: 09/12/2017 Elsevier Patient Education  2020 [employer].

## 2019-02-01 DIAGNOSIS — G4733 Obstructive sleep apnea (adult) (pediatric): Secondary | ICD-10-CM | POA: Diagnosis not present

## 2019-02-02 DIAGNOSIS — H04123 Dry eye syndrome of bilateral lacrimal glands: Secondary | ICD-10-CM | POA: Diagnosis not present

## 2019-02-25 DIAGNOSIS — Z20828 Contact with and (suspected) exposure to other viral communicable diseases: Secondary | ICD-10-CM | POA: Diagnosis not present

## 2019-02-26 ENCOUNTER — Telehealth (INDEPENDENT_AMBULATORY_CARE_PROVIDER_SITE_OTHER): Payer: Medicaid Other | Admitting: Family Medicine

## 2019-02-26 ENCOUNTER — Encounter: Payer: Self-pay | Admitting: Family Medicine

## 2019-02-26 DIAGNOSIS — Z9989 Dependence on other enabling machines and devices: Secondary | ICD-10-CM

## 2019-02-26 DIAGNOSIS — G4733 Obstructive sleep apnea (adult) (pediatric): Secondary | ICD-10-CM | POA: Diagnosis not present

## 2019-02-26 NOTE — Progress Notes (Addendum)
PATIENT: Makayla Aguilar DOB: 1966-01-30  REASON FOR VISIT: follow up HISTORY FROM: patient  Virtual Visit via Telephone Note  I connected with Makayla Aguilar on 02/26/19 at 11:00 AM EST by telephone and verified that I am speaking with the correct person using two identifiers.   I discussed the limitations, risks, security and privacy concerns of performing an evaluation and management service by telephone and the availability of in person appointments. I also discussed with the patient that there may be a patient responsible charge related to this service. The patient expressed understanding and agreed to proceed.   History of Present Illness:  02/26/19 Makayla Aguilar is a 54 y.o. female here today for follow up for OSA on CPAP. We increased set pressure from [place] to [place] at last visit. DME reset CPAP to auto titrating pressure with min pressure of 8 and max pressure of 9cmH20. She has tolerated change in pressure well. She continues to use CPAP nightly and greater than 4 hours each night. She feels that CPAP is helping her but she does have days where she is more tired. She correlates this with not getting as much sleep as other nights. Compliance report reveals that she used CPAP 30 out of the last 30 days for compliance of 100%.  29 of the last 30 days she used CPAP greater than 4 hours for compliance of 97%.  Average usage was 7 hours and 50 minutes.  Residual AHI was decreased from 10.2 and now 8.6 on 8 to 9 cm of water and an EPR of 3.  There was no significant leak noted.  Of note central apneas are now 3.0 with obstructive of 4.2.  History (copied from my note on 01/16/2019)  Makayla Aguilar is a 54 y.o. female here today for follow up of OSA on CPAP.  She reports that she is doing very well with CPAP therapy.  She is using CPAP nightly without difficulty.  She reports significant improvement and fatigue levels.  No longer having headaches.  She has no concerns  today.  Compliance report dated 12/16/2018 through 01/14/2019 reveals that she is using CPAP every night for compliance of 100%.  She used CPAP greater than 4 hours every night for compliance of 100%.  Average usage was 6 hours and 48 minutes.  Residual AHI was 10.2 on 8 cm of water and an EPR of 3.  1.6 central events noted and 6.2 obstructive.  There was no significant leak.  HISTORY: (copied from Dr Guadelupe Sabin note on 09/05/2018)  Dear Doreene Burke,   I saw your patient, Makayla Aguilar, upon your kind request in my sleep clinic today for initial consultation of her sleep disorder, in particular, concern for underlying obstructive sleep apnea. The patient is unaccompanied today. As you know, Ms. Giebler is a 54 year old right-handed woman with an underlying medical history of hypertension and obesity, who reports snoring and excessive daytime somnolence. She has had some recurrent frontal headaches for which she saw ENT. She was given a nasal spray. Her Epworth sleepiness score is 13 out of 24 today, fatigue severity score is 63 out of 63. I reviewed your office visit note from 08/27/2018. She reports difficulty initiating and maintaining sleep. She does not typically take any medication for this. She has a bedtime of around 8 PM but it is not unusual for her to fall asleep after a delay of almost 3 hours. She does not watch TV or use her phone in the bedroom.  She lives with her 45 year old son who knows to be quiet after 8 PM. She has no pets in the household. 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 She is trying to quit smoking, currently smokes at least 10/day but has plans to not buy any more cigarettes, she is on her last pack. She does not drink alcohol and caffeine and limitation, soda for lunch but otherwise no coffee or tea, she has increased her water intake. She has nocturia about 2-3 times per average night. She has woken up  with a dull and achy headache and sometimes a migraine-like headache. She had intermittent migraines in the past. She was told that she had a deviated septum but no surgery was recommended. She had seen Dr. Annalee Genta. She has tried a nasal spray which did not help very much. She is trying to lose weight. Her rise time varies, it is not unusual for her to wake up around 4. She tries to walk in the mornings when it is not so hot.    Observations/Objective:  Generalized: Well developed, in no acute distress  Mentation: Alert oriented to time, place, history taking. Follows all commands speech and language fluent   Assessment and Plan:  54 y.o. year old female  has a past medical history of Hypertension. here with    ICD-10-CM   1. OSA on CPAP  G47.33 For home use only DME continuous positive airway pressure (CPAP)   Z99.89    Makayla Aguilar continues with excellent CPAP compliance.  We adjusted set pressure at last visit but unfortunately DME set an auto titrating pressure of 8 to 9 cm of water.  We will reorder pressure changes today.  Residual AHI was reduced from 10.2-8.6.  Pressures in the 95th percentile of 8.9 cm of water.  I will send a new order for a set pressure of 10 cm of water.  We will continue to monitor both central and obstructive apneas closely.  I will repeat download in 2 to 3 weeks to assess AHI response to pressure changes.  She was encouraged to continue using CPAP nightly and for greater than 4 hours each night.  We will follow-up pending download report in 2 to 3 weeks.  She verbalizes understanding and agreement with this plan.   Orders Placed This Encounter  Procedures  . For home use only DME continuous positive airway pressure (CPAP)    Please adjust CPAP to set pressure of 10cmH20.    Order Specific Question:   Length of Need    Answer:   Lifetime    Order Specific Question:   Patient has OSA or probable OSA    Answer:   Yes    Order Specific Question:   Is  the patient currently using CPAP in the home    Answer:   Yes    Order Specific Question:   Settings    Answer:   Other see comments    Order Specific Question:   CPAP supplies needed    Answer:   Mask, headgear, cushions, filters, heated tubing and water chamber    No orders of the defined types were placed in this encounter.    Follow Up Instructions:  I discussed the assessment and treatment plan with the patient. The patient was provided an opportunity to ask questions and all were answered. The patient agreed with the plan and demonstrated an understanding of the instructions.   The patient was  advised to call back or seek an in-person evaluation if the symptoms worsen or if the condition fails to improve as anticipated.  I provided 20 minutes of non-face-to-face time during this encounter.  Patient is located at her place of residence during virtual visit.  Provider is in the office.   Shawnie Dapper, NP   I reviewed the above note and documentation by the Nurse Practitioner and agree with the history, exam, assessment and plan as outlined above. I was available for consultation. Huston Foley, MD, PhD [facility])

## 2019-02-27 ENCOUNTER — Ambulatory Visit (INDEPENDENT_AMBULATORY_CARE_PROVIDER_SITE_OTHER): Payer: Medicaid Other | Admitting: Nurse Practitioner

## 2019-02-27 ENCOUNTER — Telehealth: Payer: Self-pay | Admitting: Neurology

## 2019-02-27 ENCOUNTER — Other Ambulatory Visit: Payer: Self-pay

## 2019-02-27 ENCOUNTER — Encounter: Payer: Self-pay | Admitting: Nurse Practitioner

## 2019-02-27 VITALS — BP 132/80 | HR 94 | Temp 98.4°F | Ht 65.6 in | Wt 239.8 lb

## 2019-02-27 DIAGNOSIS — Z1231 Encounter for screening mammogram for malignant neoplasm of breast: Secondary | ICD-10-CM | POA: Diagnosis not present

## 2019-02-27 DIAGNOSIS — R0789 Other chest pain: Secondary | ICD-10-CM | POA: Diagnosis not present

## 2019-02-27 DIAGNOSIS — E669 Obesity, unspecified: Secondary | ICD-10-CM | POA: Diagnosis not present

## 2019-02-27 DIAGNOSIS — I10 Essential (primary) hypertension: Secondary | ICD-10-CM

## 2019-02-27 DIAGNOSIS — E041 Nontoxic single thyroid nodule: Secondary | ICD-10-CM

## 2019-02-27 DIAGNOSIS — R221 Localized swelling, mass and lump, neck: Secondary | ICD-10-CM

## 2019-02-27 DIAGNOSIS — Z0000 Encounter for general adult medical examination without abnormal findings: Secondary | ICD-10-CM | POA: Diagnosis not present

## 2019-02-27 LAB — POCT URINALYSIS DIPSTICK
Bilirubin, UA: NEGATIVE
Glucose, UA: NEGATIVE
Ketones, UA: NEGATIVE
Leukocytes, UA: NEGATIVE
Nitrite, UA: NEGATIVE
Protein, UA: POSITIVE — AB
Spec Grav, UA: 1.025 (ref 1.010–1.025)
Urobilinogen, UA: 0.2 E.U./dL
pH, UA: 6 (ref 5.0–8.0)

## 2019-02-27 LAB — POCT UA - MICROALBUMIN
Albumin/Creatinine Ratio, Urine, POC: 300
Creatinine, POC: 300 mg/dL
Microalbumin Ur, POC: 150 mg/L

## 2019-02-27 MED ORDER — TRAMADOL HCL 50 MG PO TABS: 50.0000 mg | ORAL_TABLET | Freq: Four times a day (QID) | ORAL | 0 refills | Status: AC | PRN

## 2019-02-27 NOTE — Progress Notes (Signed)
This visit occurred during the SARS-CoV-2 public health emergency.  Safety protocols were in place, including screening questions prior to the visit, additional usage of staff PPE, and extensive cleaning of exam room while observing appropriate contact time as indicated for disinfecting solutions.  Subjective:     Patient ID: Makayla Aguilar , female    DOB: [birth year] , 54 y.o.   MRN: [id number]   Chief Complaint  Patient presents with  . Annual Exam    HPI  Here for HM  Wt Readings from Last 3 Encounters: 02/27/19 : 239 lb 12.8 oz (108.8 kg) 01/16/19 : 236 lb 9.6 oz (107.3 kg) 09/17/18 : 220 lb 12.8 oz (100.2 kg)    Hypertension This is a chronic problem. The current episode started more than 1 year ago. The problem is unchanged. The problem is controlled. Pertinent negatives include no anxiety, chest pain or palpitations. Treatments tried: no current medications. There are no compliance problems.  There is no history of chronic renal disease.  Chest Pain  Pertinent negatives include no palpitations.  Her past medical history is significant for hypertension.    The patient states she uses status post hysterectomy for birth control.  Mammogram last done 04/11/2018. Negative for: breast discharge, breast lump(s), breast pain and breast self exam.  Pertinent negatives include abnormal bleeding (hematology), anxiety, decreased libido, depression, difficulty falling sleep, dyspareunia, history of infertility, nocturia, sexual dysfunction, sleep disturbances, urinary incontinence, urinary urgency, vaginal discharge and vaginal itching. Diet regular, she has added fruits and vegetables.  She has decreased her bread intake.  The patient states her exercise level is 4 days a week for 30 minutes at a time.       The patient's tobacco use is:  Social History   Tobacco Use  Smoking Status Current Every Day Smoker  . Packs/day: 0.50  . Types: Cigarettes  Smokeless Tobacco Never Used   She  has been exposed to passive smoke. The patient's alcohol use is:  Social History   Substance and Sexual Activity  Alcohol Use Not Currently  . Additional information: Last pap hysterectomy  Past Medical History:  Diagnosis Date  . Hypertension      Family History  Problem Relation Age of Onset  . Alcohol abuse Mother   . Diabetes Father      Current Outpatient Medications:  .  Acetaminophen (TYLENOL 8 HOUR PO), Take by mouth 2 (two) times daily as needed. OTC PRN (generic version), Disp: , Rfl:    Allergies  Allergen Reactions  . Hydrocodone     Gets really high     Review of Systems  Constitutional: Positive for fatigue.  HENT: Negative.   Eyes: Negative.   Respiratory: Negative.   Cardiovascular: Negative.  Negative for chest pain, palpitations and leg swelling.  Gastrointestinal: Negative.   Endocrine: Negative.   Genitourinary: Negative.   Musculoskeletal: Negative.   Skin: Negative.   Allergic/Immunologic: Negative.   Neurological: Negative.   Hematological: Negative.   Psychiatric/Behavioral: Negative.      Today's Vitals   02/27/19 0937  BP: 132/80  Pulse: 94  Temp: 98.4 F (36.9 C)  TempSrc: Oral  Weight: 239 lb 12.8 oz (108.8 kg)  Height: 5' 5.6" (1.666 m)  PainSc: 0-No pain   Body mass index is 39.18 kg/m.   Objective:  Physical Exam Constitutional:      Appearance: Normal appearance. She is well-developed.  HENT:     Head: Normocephalic and atraumatic.     Right Ear:  Hearing normal.     Left Ear: Hearing normal.  Eyes:     General: Lids are normal.     Extraocular Movements: Extraocular movements intact.     Conjunctiva/sclera: Conjunctivae normal.     Pupils: Pupils are equal, round, and reactive to light.     Funduscopic exam:    Right eye: No papilledema.        Left eye: No papilledema.  Neck:     Thyroid: No thyroid mass.     Vascular: No carotid bruit.  Cardiovascular:     Rate and Rhythm: Normal rate and regular rhythm.      Pulses: Normal pulses.     Heart sounds: Normal heart sounds. No murmur.  Pulmonary:     Effort: Pulmonary effort is normal. No respiratory distress.     Breath sounds: Normal breath sounds.  Abdominal:     General: Abdomen is flat. Bowel sounds are normal. There is no distension.     Palpations: Abdomen is soft.  Musculoskeletal:        General: No swelling or tenderness. Normal range of motion.     Cervical back: Full passive range of motion without pain, normal range of motion and neck supple.     Right lower leg: No edema.     Left lower leg: No edema.  Skin:    General: Skin is warm and dry.     Capillary Refill: Capillary refill takes less than 2 seconds.  Neurological:     General: No focal deficit present.     Mental Status: She is alert and oriented to person, place, and time.     Cranial Nerves: No cranial nerve deficit.     Sensory: No sensory deficit.  Psychiatric:        Mood and Affect: Mood normal.        Behavior: Behavior normal.        Thought Content: Thought content normal.        Judgment: Judgment normal.         Assessment And Plan:     1. Health maintenance examination . Behavior modifications discussed and diet history reviewed.   . Pt will continue to exercise regularly and modify diet with low GI, plant based foods and decrease intake of processed foods.  . Recommend intake of daily multivitamin, Vitamin D, and calcium.  . Recommend mammogram (due in March) for preventive screenings, as well as recommend immunizations that include influenza, TDAP - Lipid panel - VITAMIN D 25 Hydroxy (Vit-D Deficiency, Fractures)  2. Screening mammogram, encounter for  Pt instructed on Self Breast Exam.According to ACOG guidelines Women aged 73 and older are recommended to get an annual mammogram. Form completed and given to patient contact the [facility] for appointment scheduing.   Pt encouraged to get annual mammogram - MM DIGITAL SCREENING  BILATERAL; Future  3. Essential hypertension  Chronic, controlled without medications  Blood pressure is at top end of normal  EKG done with NSR  - POCT Urinalysis Dipstick (81002) - EKG 12-Lead - POCT UA - Microalbumin - CMP14+EGFR  4. Obesity (BMI 35.0-39.9 without comorbidity)  Chronic  Discussed healthy diet and regular exercise options   Encouraged to exercise at least 150 minutes per week with 2 days of strength training - Hemoglobin A1c  5. Thyroid nodule She needs a repeat thyroid ultrasound due to having nodules  - US THYROID; Future  6. Other chest pain  EKG done NSR   Will  also check D-dimer  She is having intermittent pain to mid chest which is not relieved with Tylenol or Ibuprofen   This is likely muscular, if persists or the need to have stonger pain medication need to consider referral for further evaluation - D-dimer, quantitative (not at [facility]) - traMADol (ULTRAM) 50 MG tablet; Take 1 tablet (50 mg total) by mouth every 6 (six) hours as needed.  Dispense: 20 tablet; Refill: 0    Minette Brine, FNP    THE PATIENT IS ENCOURAGED TO PRACTICE SOCIAL DISTANCING DUE TO THE COVID-19 PANDEMIC.

## 2019-02-27 NOTE — Telephone Encounter (Signed)
Patient was just started on an opiate medication by another physician and would like to know if this affects her sleep breathing on PAP-.  I explained that opiates can affect breathing but that CPAP makes the opiate therapy safer.

## 2019-02-27 NOTE — Patient Instructions (Addendum)
Health Maintenance  Topic Date Due  . PAP SMEAR-Modifier  02/27/2019 (Originally [birth year]1985)  . INFLUENZA VACCINE  05/01/2019 (Originally 09/01/2018)  . TETANUS/TDAP  05/27/2019  . MAMMOGRAM  04/10/2020  . COLONOSCOPY  08/31/2024  . HIV Screening  Completed   Health Maintenance, Female Adopting a healthy lifestyle and getting preventive care are important in promoting health and wellness. Ask your health care provider about:  The right schedule for you to have regular tests and exams.  Things you can do on your own to prevent diseases and keep yourself healthy. What should I know about diet, weight, and exercise? Eat a healthy diet   Eat a diet that includes plenty of vegetables, fruits, low-fat dairy products, and lean protein.  Do not eat a lot of foods that are high in solid fats, added sugars, or sodium. Maintain a healthy weight Body mass index (BMI) is used to identify weight problems. It estimates body fat based on height and weight. Your health care provider can help determine your BMI and help you achieve or maintain a healthy weight. Get regular exercise Get regular exercise. This is one of the most important things you can do for your health. Most adults should:  Exercise for at least 150 minutes each week. The exercise should increase your heart rate and make you sweat (moderate-intensity exercise).  Do strengthening exercises at least twice a week. This is in addition to the moderate-intensity exercise.  Spend less time sitting. Even light physical activity can be beneficial. Watch cholesterol and blood lipids Have your blood tested for lipids and cholesterol at 55 years of age, then have this test every 5 years. Have your cholesterol levels checked more often if:  Your lipid or cholesterol levels are high.  You are older than 55 years of age.  You are at high risk for heart disease. What should I know about cancer screening? Depending on your health history and  family history, you may need to have cancer screening at various ages. This may include screening for:  Breast cancer.  Cervical cancer.  Colorectal cancer.  Skin cancer.  Lung cancer. What should I know about heart disease, diabetes, and high blood pressure? Blood pressure and heart disease  High blood pressure causes heart disease and increases the risk of stroke. This is more likely to develop in people who have high blood pressure readings, are of African descent, or are overweight.  Have your blood pressure checked: ? Every 3-5 years if you are 65-83 years of age. ? Every year if you are 56 years old or older. Diabetes Have regular diabetes screenings. This checks your fasting blood sugar level. Have the screening done:  Once every three years after age 55 if you are at a normal weight and have a low risk for diabetes.  More often and at a younger age if you are overweight or have a high risk for diabetes. What should I know about preventing infection? Hepatitis B If you have a higher risk for hepatitis B, you should be screened for this virus. Talk with your health care provider to find out if you are at risk for hepatitis B infection. Hepatitis C Testing is recommended for:  Everyone born from [birth year] through 1966.  Anyone with known risk factors for hepatitis C. Sexually transmitted infections (STIs)  Get screened for STIs, including gonorrhea and chlamydia, if: ? You are sexually active and are younger than 55 years of age. ? You are older than 55 years of  age and your health care provider tells you that you are at risk for this type of infection. ? Your sexual activity has changed since you were last screened, and you are at increased risk for chlamydia or gonorrhea. Ask your health care provider if you are at risk.  Ask your health care provider about whether you are at high risk for HIV. Your health care provider may recommend a prescription medicine to help prevent  HIV infection. If you choose to take medicine to prevent HIV, you should first get tested for HIV. You should then be tested every 3 months for as long as you are taking the medicine. Pregnancy  If you are about to stop having your period (premenopausal) and you may become pregnant, seek counseling before you get pregnant.  Take 400 to 800 micrograms (mcg) of folic acid every day if you become pregnant.  Ask for birth control (contraception) if you want to prevent pregnancy. Osteoporosis and menopause Osteoporosis is a disease in which the bones lose minerals and strength with aging. This can result in bone fractures. If you are 17 years old or older, or if you are at risk for osteoporosis and fractures, ask your health care provider if you should:  Be screened for bone loss.  Take a calcium or vitamin D supplement to lower your risk of fractures.  Be given hormone replacement therapy (HRT) to treat symptoms of menopause. Follow these instructions at home: Lifestyle  Do not use any products that contain nicotine or tobacco, such as cigarettes, e-cigarettes, and chewing tobacco. If you need help quitting, ask your health care provider.  Do not use street drugs.  Do not share needles.  Ask your health care provider for help if you need support or information about quitting drugs. Alcohol use  Do not drink alcohol if: ? Your health care provider tells you not to drink. ? You are pregnant, may be pregnant, or are planning to become pregnant.  If you drink alcohol: ? Limit how much you use to 0-1 drink a day. ? Limit intake if you are breastfeeding.  Be aware of how much alcohol is in your drink. In the [place] one drink equals one 12 oz bottle of beer (355 mL), one 5 oz glass of wine (148 mL), or one 1 oz glass of hard liquor (44 mL). General instructions  Schedule regular health, dental, and eye exams.  Stay current with your vaccines.  Tell your health care provider if: ? You  often feel depressed. ? You have ever been abused or do not feel safe at home. Summary  Adopting a healthy lifestyle and getting preventive care are important in promoting health and wellness.  Follow your health care provider's instructions about healthy diet, exercising, and getting tested or screened for diseases.  Follow your health care provider's instructions on monitoring your cholesterol and blood pressure. This information is not intended to replace advice given to you by your health care provider. Make sure you discuss any questions you have with your health care provider. Document Revised: 01/10/2018 Document Reviewed: 01/10/2018 Elsevier Patient Education  2020 [employer].   Take vitamin d, vitamin c and zinc daily to boost your immune system

## 2019-03-04 DIAGNOSIS — G4733 Obstructive sleep apnea (adult) (pediatric): Secondary | ICD-10-CM | POA: Diagnosis not present

## 2019-03-08 ENCOUNTER — Ambulatory Visit
Admission: RE | Admit: 2019-03-08 | Discharge: 2019-03-08 | Disposition: A | Discharge status: Home / Self Care | Payer: Medicaid Other | Source: Ambulatory Visit | Attending: Nurse Practitioner | Admitting: Nurse Practitioner

## 2019-03-08 DIAGNOSIS — E042 Nontoxic multinodular goiter: Secondary | ICD-10-CM | POA: Diagnosis not present

## 2019-03-08 DIAGNOSIS — E041 Nontoxic single thyroid nodule: Secondary | ICD-10-CM

## 2019-03-13 ENCOUNTER — Other Ambulatory Visit: Payer: Self-pay | Admitting: Nurse Practitioner

## 2019-03-13 DIAGNOSIS — E041 Nontoxic single thyroid nodule: Secondary | ICD-10-CM

## 2019-03-13 DIAGNOSIS — R9389 Abnormal findings on diagnostic imaging of other specified body structures: Secondary | ICD-10-CM

## 2019-03-13 LAB — LIPID PANEL
Chol/HDL Ratio: 3.4 ratio (ref 0.0–4.4)
Cholesterol, Total: 182 mg/dL (ref 100–199)
HDL: 53 mg/dL (ref >39)
LDL Chol Calc (NIH): 104 mg/dL — ABNORMAL HIGH (ref 0–99)
Triglycerides: 143 mg/dL (ref 0–149)
VLDL Cholesterol Cal: 25 mg/dL (ref 5–40)

## 2019-03-13 LAB — HEMOGLOBIN A1C
Est. average glucose Bld gHb Est-mCnc: 108 mg/dL
Hgb A1c MFr Bld: 5.4 % (ref 4.8–5.6)

## 2019-03-13 LAB — D-DIMER, QUANTITATIVE

## 2019-03-13 LAB — VITAMIN D 25 HYDROXY (VIT D DEFICIENCY, FRACTURES): Vit D, 25-Hydroxy: 31.3 ng/mL (ref 30.0–100.0)

## 2019-03-13 LAB — TSH: TSH: 1.03 u[IU]/mL (ref 0.450–4.500)

## 2019-03-13 LAB — CMP14+EGFR
ALT: 14 IU/L (ref 0–32)
AST: 14 IU/L (ref 0–40)
Albumin/Globulin Ratio: 1.4 (ref 1.2–2.2)
Albumin: 4.2 g/dL (ref 3.8–4.9)
Alkaline Phosphatase: 87 IU/L (ref 39–117)
BUN/Creatinine Ratio: 13 (ref 9–23)
BUN: 12 mg/dL (ref 6–24)
Bilirubin Total: 0.2 mg/dL (ref 0.0–1.2)
CO2: 25 mmol/L (ref 20–29)
Calcium: 9.2 mg/dL (ref 8.7–10.2)
Chloride: 103 mmol/L (ref 96–106)
Creatinine, Ser: 0.94 mg/dL (ref 0.57–1.00)
GFR calc Af Amer: 80 mL/min/{1.73_m2} (ref >59)
GFR calc non Af Amer: 69 mL/min/{1.73_m2} (ref >59)
Globulin, Total: 2.9 g/dL (ref 1.5–4.5)
Glucose: 82 mg/dL (ref 65–99)
Potassium: 4.4 mmol/L (ref 3.5–5.2)
Sodium: 139 mmol/L (ref 134–144)
Total Protein: 7.1 g/dL (ref 6.0–8.5)

## 2019-03-18 ENCOUNTER — Other Ambulatory Visit: Payer: Self-pay

## 2019-03-18 DIAGNOSIS — R0789 Other chest pain: Secondary | ICD-10-CM

## 2019-03-18 NOTE — Progress Notes (Signed)
D

## 2019-03-19 ENCOUNTER — Other Ambulatory Visit: Payer: Self-pay | Admitting: Nurse Practitioner

## 2019-03-19 ENCOUNTER — Other Ambulatory Visit: Payer: Medicaid Other

## 2019-03-19 ENCOUNTER — Ambulatory Visit
Admission: RE | Admit: 2019-03-19 | Discharge: 2019-03-19 | Disposition: A | Discharge status: Home / Self Care | Payer: Medicaid Other | Source: Ambulatory Visit | Attending: Nurse Practitioner | Admitting: Nurse Practitioner

## 2019-03-19 ENCOUNTER — Other Ambulatory Visit: Payer: Self-pay

## 2019-03-19 DIAGNOSIS — R0789 Other chest pain: Secondary | ICD-10-CM | POA: Diagnosis not present

## 2019-03-20 LAB — D-DIMER, QUANTITATIVE: D-DIMER: 0.52 mg/L FEU — ABNORMAL HIGH (ref 0.00–0.49)

## 2019-03-22 ENCOUNTER — Ambulatory Visit (INDEPENDENT_AMBULATORY_CARE_PROVIDER_SITE_OTHER): Payer: Medicaid Other | Admitting: Otolaryngology

## 2019-03-26 ENCOUNTER — Other Ambulatory Visit: Payer: Self-pay

## 2019-03-26 DIAGNOSIS — R9389 Abnormal findings on diagnostic imaging of other specified body structures: Secondary | ICD-10-CM

## 2019-04-01 DIAGNOSIS — G4733 Obstructive sleep apnea (adult) (pediatric): Secondary | ICD-10-CM | POA: Diagnosis not present

## 2019-04-09 DIAGNOSIS — E041 Nontoxic single thyroid nodule: Secondary | ICD-10-CM | POA: Insufficient documentation

## 2019-04-09 DIAGNOSIS — R9389 Abnormal findings on diagnostic imaging of other specified body structures: Secondary | ICD-10-CM | POA: Diagnosis not present

## 2019-04-09 DIAGNOSIS — G4733 Obstructive sleep apnea (adult) (pediatric): Secondary | ICD-10-CM | POA: Diagnosis not present

## 2019-04-09 DIAGNOSIS — J342 Deviated nasal septum: Secondary | ICD-10-CM | POA: Diagnosis not present

## 2019-04-09 DIAGNOSIS — J343 Hypertrophy of nasal turbinates: Secondary | ICD-10-CM | POA: Diagnosis not present

## 2019-04-10 ENCOUNTER — Encounter: Payer: Self-pay | Admitting: Nurse Practitioner

## 2019-04-15 ENCOUNTER — Other Ambulatory Visit: Payer: Self-pay

## 2019-04-15 ENCOUNTER — Ambulatory Visit
Admission: RE | Admit: 2019-04-15 | Discharge: 2019-04-15 | Disposition: A | Discharge status: Home / Self Care | Payer: Medicaid Other | Source: Ambulatory Visit | Attending: Nurse Practitioner | Admitting: Nurse Practitioner

## 2019-04-15 DIAGNOSIS — Z1231 Encounter for screening mammogram for malignant neoplasm of breast: Secondary | ICD-10-CM | POA: Diagnosis not present

## 2019-04-15 MED ORDER — IBUPROFEN 800 MG PO TABS: 800.0000 mg | ORAL_TABLET | Freq: Three times a day (TID) | ORAL | 0 refills | Status: DC | PRN

## 2019-05-02 ENCOUNTER — Other Ambulatory Visit: Payer: Self-pay | Admitting: Otolaryngology

## 2019-05-02 DIAGNOSIS — E041 Nontoxic single thyroid nodule: Secondary | ICD-10-CM

## 2019-05-02 DIAGNOSIS — G4733 Obstructive sleep apnea (adult) (pediatric): Secondary | ICD-10-CM | POA: Diagnosis not present

## 2019-06-01 DIAGNOSIS — G4733 Obstructive sleep apnea (adult) (pediatric): Secondary | ICD-10-CM | POA: Diagnosis not present

## 2019-07-02 DIAGNOSIS — G4733 Obstructive sleep apnea (adult) (pediatric): Secondary | ICD-10-CM | POA: Diagnosis not present

## 2019-08-01 DIAGNOSIS — G4733 Obstructive sleep apnea (adult) (pediatric): Secondary | ICD-10-CM | POA: Diagnosis not present

## 2019-09-21 DIAGNOSIS — G4733 Obstructive sleep apnea (adult) (pediatric): Secondary | ICD-10-CM | POA: Diagnosis not present

## 2019-10-17 ENCOUNTER — Telehealth: Payer: Self-pay

## 2019-10-17 NOTE — Telephone Encounter (Signed)
PT LVM REQ APPT ATT TO RTN CALL NO ANS LVM FOR PT TO CALL [facility].

## 2019-10-19 DIAGNOSIS — B079 Viral wart, unspecified: Secondary | ICD-10-CM | POA: Diagnosis not present

## 2019-10-23 DIAGNOSIS — B079 Viral wart, unspecified: Secondary | ICD-10-CM | POA: Diagnosis not present

## 2020-01-14 ENCOUNTER — Telehealth: Payer: Self-pay | Admitting: Family Medicine

## 2020-01-14 NOTE — Telephone Encounter (Signed)
[facility] [place] [place] [id number] called, Request for CPAP from [facility] for 12/31/19-03/30/20.  Already had a claim for Medical Supply company submitted, dates 12/18/19-03/30/20. The dates are overlapping Does she need this request submitted by [facility] or can we withdraw this one? Can contact: [phone], ext: [phone].

## 2020-01-14 NOTE — Telephone Encounter (Signed)
I have sent this to [facility]. We do not do PAs for medical equipment.

## 2020-03-03 DIAGNOSIS — G4733 Obstructive sleep apnea (adult) (pediatric): Secondary | ICD-10-CM | POA: Diagnosis not present

## 2020-03-05 ENCOUNTER — Encounter: Payer: Self-pay | Admitting: Nurse Practitioner

## 2020-03-05 ENCOUNTER — Encounter: Payer: Medicaid Other | Admitting: Internal Medicine

## 2020-03-05 ENCOUNTER — Ambulatory Visit: Payer: Medicaid Other | Admitting: Nurse Practitioner

## 2020-03-05 ENCOUNTER — Other Ambulatory Visit: Payer: Self-pay

## 2020-03-05 VITALS — BP 162/88 | HR 80 | Temp 98.5°F | Ht 65.6 in | Wt 240.0 lb

## 2020-03-05 DIAGNOSIS — E669 Obesity, unspecified: Secondary | ICD-10-CM

## 2020-03-05 DIAGNOSIS — Z0000 Encounter for general adult medical examination without abnormal findings: Secondary | ICD-10-CM

## 2020-03-05 DIAGNOSIS — I10 Essential (primary) hypertension: Secondary | ICD-10-CM | POA: Diagnosis not present

## 2020-03-05 DIAGNOSIS — Z2821 Immunization not carried out because of patient refusal: Secondary | ICD-10-CM

## 2020-03-05 DIAGNOSIS — E7800 Pure hypercholesterolemia, unspecified: Secondary | ICD-10-CM | POA: Diagnosis not present

## 2020-03-05 LAB — POCT URINALYSIS DIPSTICK
Bilirubin, UA: NEGATIVE
Glucose, UA: NEGATIVE
Ketones, UA: NEGATIVE
Leukocytes, UA: NEGATIVE
Nitrite, UA: NEGATIVE
Protein, UA: POSITIVE — AB
Spec Grav, UA: 1.025 (ref 1.010–1.025)
Urobilinogen, UA: 0.2 E.U./dL
pH, UA: 6 (ref 5.0–8.0)

## 2020-03-05 LAB — POCT UA - MICROALBUMIN
Creatinine, POC: 300 mg/dL
Microalbumin Ur, POC: 150 mg/L

## 2020-03-05 NOTE — Patient Instructions (Signed)
Hypertension, Adult Hypertension is another name for high blood pressure. High blood pressure forces your heart to work harder to pump blood. This can cause problems over time. There are two numbers in a blood pressure reading. There is a top number (systolic) over a bottom number (diastolic). It is best to have a blood pressure that is below 120/80. Healthy choices can help lower your blood pressure, or you may need medicine to help lower it. What are the causes? The cause of this condition is not known. Some conditions may be related to high blood pressure. What increases the risk?  Smoking.  Having type 2 diabetes mellitus, high cholesterol, or both.  Not getting enough exercise or physical activity.  Being overweight.  Having too much fat, sugar, calories, or salt (sodium) in your diet.  Drinking too much alcohol.  Having long-term (chronic) kidney disease.  Having a family history of high blood pressure.  Age. Risk increases with age.  Race. You may be at higher risk if you are African American.  Gender. Men are at higher risk than women before age 45. After age 65, women are at higher risk than men.  Having obstructive sleep apnea.  Stress. What are the signs or symptoms?  High blood pressure may not cause symptoms. Very high blood pressure (hypertensive crisis) may cause: ? Headache. ? Feelings of worry or nervousness (anxiety). ? Shortness of breath. ? Nosebleed. ? A feeling of being sick to your stomach (nausea). ? Throwing up (vomiting). ? Changes in how you see. ? Very bad chest pain. ? Seizures. How is this treated?  This condition is treated by making healthy lifestyle changes, such as: ? Eating healthy foods. ? Exercising more. ? Drinking less alcohol.  Your health care provider may prescribe medicine if lifestyle changes are not enough to get your blood pressure under control, and if: ? Your top number is above 130. ? Your bottom number is above  80.  Your personal target blood pressure may vary. Follow these instructions at home: Eating and drinking  If told, follow the DASH eating plan. To follow this plan: ? Fill one half of your plate at each meal with fruits and vegetables. ? Fill one fourth of your plate at each meal with whole grains. Whole grains include whole-wheat pasta, brown rice, and whole-grain bread. ? Eat or drink low-fat dairy products, such as skim milk or low-fat yogurt. ? Fill one fourth of your plate at each meal with low-fat (lean) proteins. Low-fat proteins include fish, chicken without skin, eggs, beans, and tofu. ? Avoid fatty meat, cured and processed meat, or chicken with skin. ? Avoid pre-made or processed food.  Eat less than 1,500 mg of salt each day.  Do not drink alcohol if: ? Your doctor tells you not to drink. ? You are pregnant, may be pregnant, or are planning to become pregnant.  If you drink alcohol: ? Limit how much you use to:  0-1 drink a day for women.  0-2 drinks a day for men. ? Be aware of how much alcohol is in your drink. In the [place] one drink equals one 12 oz bottle of beer (355 mL), one 5 oz glass of wine (148 mL), or one 1 oz glass of hard liquor (44 mL).   Lifestyle  Work with your doctor to stay at a healthy weight or to lose weight. Ask your doctor what the best weight is for you.  Get at least 30 minutes of exercise most   days of the week. This may include walking, swimming, or biking.  Get at least 30 minutes of exercise that strengthens your muscles (resistance exercise) at least 3 days a week. This may include lifting weights or doing Pilates.  Do not use any products that contain nicotine or tobacco, such as cigarettes, e-cigarettes, and chewing tobacco. If you need help quitting, ask your doctor.  Check your blood pressure at home as told by your doctor.  Keep all follow-up visits as told by your doctor. This is important.   Medicines  Take over-the-counter  and prescription medicines only as told by your doctor. Follow directions carefully.  Do not skip doses of blood pressure medicine. The medicine does not work as well if you skip doses. Skipping doses also puts you at risk for problems.  Ask your doctor about side effects or reactions to medicines that you should watch for. Contact a doctor if you:  Think you are having a reaction to the medicine you are taking.  Have headaches that keep coming back (recurring).  Feel dizzy.  Have swelling in your ankles.  Have trouble with your vision. Get help right away if you:  Get a very bad headache.  Start to feel mixed up (confused).  Feel weak or numb.  Feel faint.  Have very bad pain in your: ? Chest. ? Belly (abdomen).  Throw up more than once.  Have trouble breathing. Summary  Hypertension is another name for high blood pressure.  High blood pressure forces your heart to work harder to pump blood.  For most people, a normal blood pressure is less than 120/80.  Making healthy choices can help lower blood pressure. If your blood pressure does not get lower with healthy choices, you may need to take medicine. This information is not intended to replace advice given to you by your health care provider. Make sure you discuss any questions you have with your health care provider. Document Revised: 09/27/2017 Document Reviewed: 09/27/2017 Elsevier Patient Education  2021 [employer] Health Maintenance, Female Adopting a healthy lifestyle and getting preventive care are important in promoting health and wellness. Ask your health care provider about:  The right schedule for you to have regular tests and exams.  Things you can do on your own to prevent diseases and keep yourself healthy. What should I know about diet, weight, and exercise? Eat a healthy diet  Eat a diet that includes plenty of vegetables, fruits, low-fat dairy products, and lean protein.  Do not eat a lot  of foods that are high in solid fats, added sugars, or sodium.   Maintain a healthy weight Body mass index (BMI) is used to identify weight problems. It estimates body fat based on height and weight. Your health care provider can help determine your BMI and help you achieve or maintain a healthy weight. Get regular exercise Get regular exercise. This is one of the most important things you can do for your health. Most adults should:  Exercise for at least 150 minutes each week. The exercise should increase your heart rate and make you sweat (moderate-intensity exercise).  Do strengthening exercises at least twice a week. This is in addition to the moderate-intensity exercise.  Spend less time sitting. Even light physical activity can be beneficial. Watch cholesterol and blood lipids Have your blood tested for lipids and cholesterol at 56 years of age, then have this test every 5 years. Have your cholesterol levels checked more often if:  Your lipid or   cholesterol levels are high.  You are older than 56 years of age.  You are at high risk for heart disease. What should I know about cancer screening? Depending on your health history and family history, you may need to have cancer screening at various ages. This may include screening for:  Breast cancer.  Cervical cancer.  Colorectal cancer.  Skin cancer.  Lung cancer. What should I know about heart disease, diabetes, and high blood pressure? Blood pressure and heart disease  High blood pressure causes heart disease and increases the risk of stroke. This is more likely to develop in people who have high blood pressure readings, are of African descent, or are overweight.  Have your blood pressure checked: ? Every 3-5 years if you are 18-39 years of age. ? Every year if you are 40 years old or older. Diabetes Have regular diabetes screenings. This checks your fasting blood sugar level. Have the screening done:  Once every three  years after age 40 if you are at a normal weight and have a low risk for diabetes.  More often and at a younger age if you are overweight or have a high risk for diabetes. What should I know about preventing infection? Hepatitis B If you have a higher risk for hepatitis B, you should be screened for this virus. Talk with your health care provider to find out if you are at risk for hepatitis B infection. Hepatitis C Testing is recommended for:  Everyone born from 1945 through 1965.  Anyone with known risk factors for hepatitis C. Sexually transmitted infections (STIs)  Get screened for STIs, including gonorrhea and chlamydia, if: ? You are sexually active and are younger than 56 years of age. ? You are older than 56 years of age and your health care provider tells you that you are at risk for this type of infection. ? Your sexual activity has changed since you were last screened, and you are at increased risk for chlamydia or gonorrhea. Ask your health care provider if you are at risk.  Ask your health care provider about whether you are at high risk for HIV. Your health care provider may recommend a prescription medicine to help prevent HIV infection. If you choose to take medicine to prevent HIV, you should first get tested for HIV. You should then be tested every 3 months for as long as you are taking the medicine. Pregnancy  If you are about to stop having your period (premenopausal) and you may become pregnant, seek counseling before you get pregnant.  Take 400 to 800 micrograms (mcg) of folic acid every day if you become pregnant.  Ask for birth control (contraception) if you want to prevent pregnancy. Osteoporosis and menopause Osteoporosis is a disease in which the bones lose minerals and strength with aging. This can result in bone fractures. If you are 65 years old or older, or if you are at risk for osteoporosis and fractures, ask your health care provider if you should:  Be  screened for bone loss.  Take a calcium or vitamin D supplement to lower your risk of fractures.  Be given hormone replacement therapy (HRT) to treat symptoms of menopause. Follow these instructions at home: Lifestyle  Do not use any products that contain nicotine or tobacco, such as cigarettes, e-cigarettes, and chewing tobacco. If you need help quitting, ask your health care provider.  Do not use street drugs.  Do not share needles.  Ask your health care provider for   help if you need support or information about quitting drugs. Alcohol use  Do not drink alcohol if: ? Your health care provider tells you not to drink. ? You are pregnant, may be pregnant, or are planning to become pregnant.  If you drink alcohol: ? Limit how much you use to 0-1 drink a day. ? Limit intake if you are breastfeeding.  Be aware of how much alcohol is in your drink. In the [place] one drink equals one 12 oz bottle of beer (355 mL), one 5 oz glass of wine (148 mL), or one 1 oz glass of hard liquor (44 mL). General instructions  Schedule regular health, dental, and eye exams.  Stay current with your vaccines.  Tell your health care provider if: ? You often feel depressed. ? You have ever been abused or do not feel safe at home. Summary  Adopting a healthy lifestyle and getting preventive care are important in promoting health and wellness.  Follow your health care provider's instructions about healthy diet, exercising, and getting tested or screened for diseases.  Follow your health care provider's instructions on monitoring your cholesterol and blood pressure. This information is not intended to replace advice given to you by your health care provider. Make sure you discuss any questions you have with your health care provider. Document Revised: 01/10/2018 Document Reviewed: 01/10/2018 Elsevier Patient Education  2021 [employer] Inc[employer]  

## 2020-03-05 NOTE — Progress Notes (Signed)
Rutherford Nail as a scribe for Minette Brine, FNP.,have documented all relevant documentation on the behalf of Minette Brine, FNP,as directed by  Minette Brine, FNP while in the presence of Minette Brine, [place]. This visit occurred during the SARS-CoV-2 public health emergency.  Safety protocols were in place, including screening questions prior to the visit, additional usage of staff PPE, and extensive cleaning of exam room while observing appropriate contact time as indicated for disinfecting solutions.  Subjective:     Patient ID: Makayla Aguilar , female    DOB: 1965-04-05 , 55 y.o.   MRN: [id number]   Chief Complaint  Patient presents with  . Annual Exam    HPI  Here for HM. She has been going to the dentist for peridontal disease and the roof of her mouth has been swelling.  She is going to [facility].    Wt Readings from Last 3 Encounters: 03/05/20 : 240 lb (108.9 kg) 02/27/19 : 239 lb 12.8 oz (108.8 kg) 01/16/19 : 236 lb 9.6 oz (107.3 kg)  Hypertension This is a chronic problem. The current episode started more than 1 year ago. The problem is unchanged. The problem is controlled. Pertinent negatives include no anxiety, chest pain or palpitations. There are no associated agents to hypertension. Treatments tried: no current medications. There are no compliance problems.  There is no history of chronic renal disease.  Chest Pain  Pertinent negatives include no palpitations.  Her past medical history is significant for hypertension.     Past Medical History:  Diagnosis Date  . Hypertension      Family History  Problem Relation Age of Onset  . Alcohol abuse Mother   . Diabetes Father      Current Outpatient Medications:  .  Acetaminophen (TYLENOL 8 HOUR PO), Take by mouth 2 (two) times daily as needed. OTC PRN (generic version), Disp: , Rfl:  .  ibuprofen (ADVIL) 800 MG tablet, Take 1 tablet (800 mg total) by mouth every 8 (eight) hours as needed., Disp: 30 tablet,  Rfl: 0 .  losartan (COZAAR) 25 MG tablet, Take 1 tablet (25 mg total) by mouth daily., Disp: 30 tablet, Rfl: 2   Allergies  Allergen Reactions  . Hydrocodone     Gets really high      The patient states she is status post hysterectomy  . Negative for: breast discharge, breast lump(s), breast pain and breast self exam. Associated symptoms include abnormal vaginal bleeding. Pertinent negatives include abnormal bleeding (hematology), anxiety, decreased libido, depression, difficulty falling sleep, dyspareunia, history of infertility, nocturia, sexual dysfunction, sleep disturbances, urinary incontinence, urinary urgency, vaginal discharge and vaginal itching. Diet regular. The patient states her exercise level is none other than at work with her job     The patient's tobacco use is:  Social History   Tobacco Use  Smoking Status Former Smoker  . Packs/day: 0.50  . Types: Cigarettes  . Quit date: 02/01/2019  . Years since quitting: 1.1  Smokeless Tobacco Never Used   She has been exposed to passive smoke. The patient's alcohol use is:  Social History   Substance and Sexual Activity  Alcohol Use Not Currently     Review of Systems  Constitutional: Negative.   HENT: Negative.        Roof of her mouth has been swelling  Eyes: Negative.   Respiratory: Negative.   Cardiovascular: Negative for chest pain, palpitations and leg swelling.  Gastrointestinal: Negative.   Endocrine: Negative.   Genitourinary:  Negative.   Musculoskeletal: Negative.   Skin: Negative.   Neurological: Negative.   Hematological: Negative.   Psychiatric/Behavioral: Negative.      Today's Vitals   03/05/20 0958  BP: (!) 162/88  Pulse: 80  Temp: 98.5 F (36.9 C)  TempSrc: Oral  Weight: 240 lb (108.9 kg)  Height: 5' 5.6" (1.666 m)  PainSc: 0-No pain   Body mass index is 39.21 kg/m.   Objective:  Physical Exam Constitutional:      General: She is not in acute distress.    Appearance: Normal  appearance. She is well-developed. She is obese.  HENT:     Head: Normocephalic and atraumatic.     Right Ear: Hearing, tympanic membrane, ear canal and external ear normal. There is no impacted cerumen.     Left Ear: Hearing, tympanic membrane, ear canal and external ear normal. There is no impacted cerumen.     Nose:     Comments: Deferred - masked    Mouth/Throat:     Comments: Deferred - masked Eyes:     General: Lids are normal.     Extraocular Movements: Extraocular movements intact.     Conjunctiva/sclera: Conjunctivae normal.     Pupils: Pupils are equal, round, and reactive to light.     Funduscopic exam:    Right eye: No papilledema.        Left eye: No papilledema.  Neck:     Thyroid: No thyroid mass.     Vascular: No carotid bruit.  Cardiovascular:     Rate and Rhythm: Normal rate and regular rhythm.     Pulses: Normal pulses.     Heart sounds: Normal heart sounds. No murmur heard.   Pulmonary:     Effort: Pulmonary effort is normal. No respiratory distress.     Breath sounds: Normal breath sounds. No wheezing.  Chest:     Chest wall: No mass.  Breasts:     Tanner Score is 5.     Right: Normal. No mass, tenderness, axillary adenopathy or supraclavicular adenopathy.     Left: Normal. No mass, tenderness, axillary adenopathy or supraclavicular adenopathy.    Abdominal:     General: Abdomen is flat. Bowel sounds are normal. There is no distension.     Palpations: Abdomen is soft.     Tenderness: There is no abdominal tenderness.  Genitourinary:    Rectum: Guaiac result negative.  Musculoskeletal:        General: No swelling. Normal range of motion.     Cervical back: Full passive range of motion without pain, normal range of motion and neck supple.     Right lower leg: No edema.     Left lower leg: No edema.  Lymphadenopathy:     Upper Body:     Right upper body: No supraclavicular, axillary or pectoral adenopathy.     Left upper body: No supraclavicular,  axillary or pectoral adenopathy.  Skin:    General: Skin is warm and dry.     Capillary Refill: Capillary refill takes less than 2 seconds.  Neurological:     General: No focal deficit present.     Mental Status: She is alert and oriented to person, place, and time.     Cranial Nerves: No cranial nerve deficit.     Sensory: No sensory deficit.  Psychiatric:        Mood and Affect: Mood normal.        Behavior: Behavior normal.  Thought Content: Thought content normal.        Judgment: Judgment normal.         Assessment And Plan:     1. Health maintenance examination . Behavior modifications discussed and diet history reviewed.   . Pt will continue to exercise regularly and modify diet with low GI, plant based foods and decrease intake of processed foods.  . Recommend intake of daily multivitamin, Vitamin D, and calcium.  . Recommend mammogram and colonoscopy (both are up to date) for preventive screenings, as well as recommend immunizations that include influenza, TDAP, and Shingles - CBC  2. Essential hypertension  Chronic, her blood pressure is elevated today she is not on any medications at this time  She is encouraged to limit her intake of high salt foods and stay well hydrated with water - POCT Urinalysis Dipstick (81002) - POCT UA - Microalbumin - EKG 12-Lead - CMP14+EGFR  3. Elevated cholesterol  Chronic, stable  No current medications  Will check lipid panel  Encouraged to limit intake of fried and fatty foods. - Lipid panel  4. Influenza vaccination declined  I have recommended that this patient have a flu shot but she declines at this time. I have discussed the risks and benefits of this procedure with her. The patient verbalizes understanding.  5. COVID-19 vaccine dose declined  Declines covid 19 vaccine. Discussed risk of covid 61 and if she changes her mind about the vaccine to call the office.  Encouraged to take multivitamin, vitamin d,  vitamin c and zinc to increase immune system. Aware can call office if would like to have vaccine here at office.   6. Obesity (BMI 35.0-39.9 without comorbidity)  Chronic  Discussed healthy diet and regular exercise options   Encouraged to exercise at least 150 minutes per week with 2 days of strength training as tolerated    Patient was given opportunity to ask questions. Patient verbalized understanding of the plan and was able to repeat key elements of the plan. All questions were answered to their satisfaction.   Minette Brine, FNP    I, Minette Brine, FNP, have reviewed all documentation for this visit. The documentation on 03/29/20 for the exam, diagnosis, procedures, and orders are all accurate and complete.   THE PATIENT IS ENCOURAGED TO PRACTICE SOCIAL DISTANCING DUE TO THE COVID-19 PANDEMIC.

## 2020-03-06 LAB — CBC
Hematocrit: 38.7 % (ref 34.0–46.6)
Hemoglobin: 13 g/dL (ref 11.1–15.9)
MCH: 29.3 pg (ref 26.6–33.0)
MCHC: 33.6 g/dL (ref 31.5–35.7)
MCV: 87 fL (ref 79–97)
Platelets: 395 10*3/uL (ref 150–450)
RBC: 4.43 x10E6/uL (ref 3.77–5.28)
RDW: 13.4 % (ref 11.7–15.4)
WBC: 10.4 10*3/uL (ref 3.4–10.8)

## 2020-03-06 LAB — CMP14+EGFR
ALT: 16 IU/L (ref 0–32)
AST: 18 IU/L (ref 0–40)
Albumin/Globulin Ratio: 1.4 (ref 1.2–2.2)
Albumin: 4.1 g/dL (ref 3.8–4.9)
Alkaline Phosphatase: 97 IU/L (ref 44–121)
BUN/Creatinine Ratio: 17 (ref 9–23)
BUN: 16 mg/dL (ref 6–24)
Bilirubin Total: <0.2 mg/dL (ref 0.0–1.2)
CO2: 24 mmol/L (ref 20–29)
Calcium: 9.4 mg/dL (ref 8.7–10.2)
Chloride: 107 mmol/L — ABNORMAL HIGH (ref 96–106)
Creatinine, Ser: 0.92 mg/dL (ref 0.57–1.00)
GFR calc Af Amer: 81 mL/min/{1.73_m2} (ref >59)
GFR calc non Af Amer: 70 mL/min/{1.73_m2} (ref >59)
Globulin, Total: 3 g/dL (ref 1.5–4.5)
Glucose: 65 mg/dL (ref 65–99)
Potassium: 4.6 mmol/L (ref 3.5–5.2)
Sodium: 145 mmol/L — ABNORMAL HIGH (ref 134–144)
Total Protein: 7.1 g/dL (ref 6.0–8.5)

## 2020-03-06 LAB — LIPID PANEL
Chol/HDL Ratio: 4.5 ratio — ABNORMAL HIGH (ref 0.0–4.4)
Cholesterol, Total: 202 mg/dL — ABNORMAL HIGH (ref 100–199)
HDL: 45 mg/dL (ref >39)
LDL Chol Calc (NIH): 126 mg/dL — ABNORMAL HIGH (ref 0–99)
Triglycerides: 177 mg/dL — ABNORMAL HIGH (ref 0–149)
VLDL Cholesterol Cal: 31 mg/dL (ref 5–40)

## 2020-03-11 ENCOUNTER — Other Ambulatory Visit: Payer: Self-pay | Admitting: Nurse Practitioner

## 2020-03-11 DIAGNOSIS — I10 Essential (primary) hypertension: Secondary | ICD-10-CM

## 2020-03-11 MED ORDER — LOSARTAN POTASSIUM 25 MG PO TABS: 25.0000 mg | ORAL_TABLET | Freq: Every day | ORAL | 2 refills | Status: DC

## 2020-03-11 NOTE — Progress Notes (Signed)
Prescription was sent to pharmacy for losartan, have her to follow up in 6 weeks

## 2020-03-13 ENCOUNTER — Other Ambulatory Visit: Payer: Self-pay | Admitting: Nurse Practitioner

## 2020-03-13 DIAGNOSIS — I10 Essential (primary) hypertension: Secondary | ICD-10-CM

## 2020-03-23 ENCOUNTER — Other Ambulatory Visit: Payer: Self-pay | Admitting: Nurse Practitioner

## 2020-03-23 DIAGNOSIS — Z1231 Encounter for screening mammogram for malignant neoplasm of breast: Secondary | ICD-10-CM

## 2020-05-01 DIAGNOSIS — G4733 Obstructive sleep apnea (adult) (pediatric): Secondary | ICD-10-CM | POA: Diagnosis not present

## 2020-05-11 ENCOUNTER — Inpatient Hospital Stay: Admission: RE | Admit: 2020-05-11 | Payer: Medicaid Other | Source: Ambulatory Visit

## 2020-06-20 DIAGNOSIS — G4733 Obstructive sleep apnea (adult) (pediatric): Secondary | ICD-10-CM | POA: Diagnosis not present

## 2020-07-21 DIAGNOSIS — G4733 Obstructive sleep apnea (adult) (pediatric): Secondary | ICD-10-CM | POA: Diagnosis not present

## 2020-08-20 DIAGNOSIS — G4733 Obstructive sleep apnea (adult) (pediatric): Secondary | ICD-10-CM | POA: Diagnosis not present

## 2020-09-02 ENCOUNTER — Ambulatory Visit: Payer: Medicaid Other | Admitting: Nurse Practitioner

## 2020-09-11 ENCOUNTER — Encounter (HOSPITAL_COMMUNITY): Payer: Self-pay | Admitting: Emergency Medicine

## 2020-09-11 ENCOUNTER — Ambulatory Visit (HOSPITAL_COMMUNITY)
Admission: EM | Admit: 2020-09-11 | Discharge: 2020-09-11 | Disposition: A | Discharge status: Home / Self Care | Payer: Medicaid Other | Attending: Family Medicine | Admitting: Family Medicine

## 2020-09-11 ENCOUNTER — Other Ambulatory Visit: Payer: Self-pay

## 2020-09-11 DIAGNOSIS — S8012XA Contusion of left lower leg, initial encounter: Secondary | ICD-10-CM | POA: Diagnosis not present

## 2020-09-11 HISTORY — DX: Sleep apnea, unspecified: G47.30

## 2020-09-11 HISTORY — DX: Obstructive sleep apnea (adult) (pediatric): G47.33

## 2020-09-11 MED ORDER — IBUPROFEN 800 MG PO TABS: 800.0000 mg | ORAL_TABLET | Freq: Three times a day (TID) | ORAL | 0 refills | Status: DC | PRN

## 2020-09-11 MED ORDER — LIDOCAINE 5 % EX PTCH: 1.0000 | MEDICATED_PATCH | CUTANEOUS | 0 refills | Status: DC

## 2020-09-11 NOTE — ED Triage Notes (Signed)
Bruising to left thigh on-going x 1 month. Patient state's she feels like it's growing in size. Purple/red in coloration. About 2.5 inches in diameter

## 2020-09-11 NOTE — ED Provider Notes (Signed)
[facility]    CSN: [id number] Arrival date & time: 09/11/20  1615      History   Chief Complaint Chief Complaint  Patient presents with   Leg Pain    HPI Makayla Aguilar is a 55 y.o. female.   Patient presenting today with 1 month history of bruising, swelling, soreness to the left lateral upper leg.  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  Denies any diffuse leg swelling or pain, numbness, tingling, weakness, decreased range of motion, anticoagulation.  Taking over-the-counter pain relievers with minimal relief.   Past Medical History:  Diagnosis Date   Hypertension    OSA on CPAP    Sleep apnea     Patient Active Problem List   Diagnosis Date Noted   Obesity (BMI 35.0-39.9 without comorbidity) 02/27/2019   Essential hypertension 08/27/2018   Other fatigue 08/27/2018   Sinus pressure 04/25/2018   Acute recurrent frontal sinusitis 04/25/2018   Viral infection 02/19/2018   Mass of thyroid region 02/19/2018   Non-seasonal allergic rhinitis 02/19/2018   Increased thirst 02/19/2018   Elevated blood pressure reading without diagnosis of hypertension 02/19/2018    Past Surgical History:  Procedure Laterality Date   ABDOMINAL HYSTERECTOMY      OB History   No obstetric history on file.      Home Medications    Prior to Admission medications   Medication Sig Start Date End Date Taking? Authorizing Provider  lidocaine (LIDODERM) 5 % Place 1 patch onto the skin daily. Remove & Discard patch within 12 hours or as directed by MD 09/11/20  Yes Particia Nearing, PA-C  Acetaminophen (TYLENOL 8 HOUR PO) Take by mouth 2 (two) times daily as needed. OTC PRN (generic version)    [provider]  ibuprofen (ADVIL) 800 MG tablet Take 1 tablet (800 mg total) by mouth every 8 (eight) hours as needed. 09/11/20   Particia Nearing, PA-C  losartan (COZAAR) 25 MG tablet Take 1 tablet (25 mg total) by mouth daily. 03/11/20 03/11/21  [name], FNP    Family History Family History  Problem Relation Age of Onset   Alcohol abuse Mother    Diabetes Father     Social History Social History   Tobacco Use   Smoking status: Former    Packs/day: 0.50    Types: Cigarettes    Quit date: 02/01/2019    Years since quitting: 1.6   Smokeless tobacco: Never  Substance Use Topics   Alcohol use: Not Currently   Drug use: Not Currently     Allergies   Hydrocodone   Review of Systems Review of Systems Per HPI  Physical Exam Triage Vital Signs ED Triage Vitals  Enc Vitals Group     BP 09/11/20 1757 (!) 186/99     Pulse Rate 09/11/20 1757 83     Resp 09/11/20 1757 16     Temp 09/11/20 1757 98.3 F (36.8 C)     Temp Source 09/11/20 1757 Oral     SpO2 09/11/20 1757 96 %     Weight --      Height --      Head Circumference --      Peak Flow --      Pain Score 09/11/20 1758 8     Pain Loc --  Pain Edu? --      Excl. in GC? --    No data found.  Updated Vital Signs BP (!) 186/99 (BP Location: Left Arm)   Pulse 83   Temp 98.3 F (36.8 C) (Oral)   Resp 16   SpO2 96%   Visual Acuity Right Eye Distance:   Left Eye Distance:   Bilateral Distance:    Right Eye Near:   Left Eye Near:    Bilateral Near:     Physical Exam Vitals and nursing note reviewed.  Constitutional:      Appearance: Normal appearance. She is not ill-appearing.  HENT:     Head: Atraumatic.     Mouth/Throat:     Mouth: Mucous membranes are moist.     Pharynx: Oropharynx is clear.  Eyes:     Extraocular Movements: Extraocular movements intact.     Conjunctiva/sclera: Conjunctivae normal.  Cardiovascular:     Rate and Rhythm: Normal rate and regular rhythm.     Heart sounds: Normal heart sounds.  Pulmonary:     Effort: Pulmonary effort is normal.     Breath sounds: Normal breath sounds. No wheezing or  rales.  Musculoskeletal:        General: Tenderness present. Normal range of motion.     Cervical back: Normal range of motion and neck supple.  Skin:    General: Skin is warm and dry.     Findings: Bruising present. No erythema.     Comments: 1.5 cm healing bruise with trace edema surrounding area on left lateral upper leg.  Significantly tender to palpation  Neurological:     Mental Status: She is alert and oriented to person, place, and time.     Comments: Left lower extremity neurovascular intact  Psychiatric:        Mood and Affect: Mood normal.        Thought Content: Thought content normal.        Judgment: Judgment normal.     UC Treatments / Results  Labs (all labs ordered are listed, but only abnormal results are displayed) Labs Reviewed - No data to display  EKG   Radiology No results found.  Procedures Procedures (including critical care time)  Medications Ordered in UC Medications - No data to display  Initial Impression / Assessment and Plan / UC Course  I have reviewed the triage vital signs and the nursing notes.  Pertinent labs & imaging results that were available during my care of the patient were reviewed by me and considered in my medical decision making (see chart for details).     Healing contusion, discussed continued ice, elevation, ibuprofen, lidocaine patches for pain relief.  Follow-up if worsening or not resolving.  Final Clinical Impressions(s) / UC Diagnoses   Final diagnoses:  Contusion of left lower extremity, initial encounter   Discharge Instructions   None    ED Prescriptions     Medication Sig Dispense Auth. Provider   lidocaine (LIDODERM) 5 % Place 1 patch onto the skin daily. Remove & Discard patch within 12 hours or as directed by MD 30 patch Particia Nearing, PA-C   ibuprofen (ADVIL) 800 MG tablet Take 1 tablet (800 mg total) by mouth every 8 (eight) hours as needed. 30 tablet Particia Nearing, [place]       PDMP not reviewed this encounter.   Particia Nearing, [place] 09/11/20 1842

## 2020-09-17 DIAGNOSIS — U071 COVID-19: Secondary | ICD-10-CM | POA: Diagnosis not present

## 2020-09-20 DIAGNOSIS — G4733 Obstructive sleep apnea (adult) (pediatric): Secondary | ICD-10-CM | POA: Diagnosis not present

## 2020-10-03 DIAGNOSIS — J029 Acute pharyngitis, unspecified: Secondary | ICD-10-CM | POA: Diagnosis not present

## 2020-10-03 DIAGNOSIS — J0100 Acute maxillary sinusitis, unspecified: Secondary | ICD-10-CM | POA: Diagnosis not present

## 2020-10-21 DIAGNOSIS — G4733 Obstructive sleep apnea (adult) (pediatric): Secondary | ICD-10-CM | POA: Diagnosis not present

## 2020-11-11 ENCOUNTER — Ambulatory Visit (INDEPENDENT_AMBULATORY_CARE_PROVIDER_SITE_OTHER): Payer: Worker's Compensation | Admitting: Orthopaedic Surgery

## 2020-11-11 ENCOUNTER — Encounter: Payer: Self-pay | Admitting: Orthopaedic Surgery

## 2020-11-11 DIAGNOSIS — M25531 Pain in right wrist: Secondary | ICD-10-CM | POA: Diagnosis not present

## 2020-11-11 DIAGNOSIS — M25532 Pain in left wrist: Secondary | ICD-10-CM

## 2020-11-11 DIAGNOSIS — M25522 Pain in left elbow: Secondary | ICD-10-CM

## 2020-11-11 DIAGNOSIS — M25521 Pain in right elbow: Secondary | ICD-10-CM

## 2020-11-11 MED ORDER — TRAMADOL HCL 50 MG PO TABS: 50.0000 mg | ORAL_TABLET | Freq: Every day | ORAL | 0 refills | Status: DC | PRN

## 2020-11-11 MED ORDER — DICLOFENAC SODIUM 75 MG PO TBEC: 75.0000 mg | DELAYED_RELEASE_TABLET | Freq: Two times a day (BID) | ORAL | 2 refills | Status: DC

## 2020-11-11 NOTE — Progress Notes (Signed)
Office Visit Note   Patient: Makayla Aguilar           Date of Birth: 1965-04-04           MRN: [id number] Visit Date: 11/11/2020              Requested by: [name], FNP [address] [place],  [place] [address] PCP: [name], FNP   Assessment & Plan: Visit Diagnoses:  1. Bilateral wrist pain   2. Bilateral elbow joint pain     Plan: Based on findings she may have a nondisplaced left radial head fracture.  I did review all of her x-rays from [facility] urgent care and I do not see any evidence for scaphoid fracture.  I feel that she is just pretty banged up from the fall so I recommended symptomatic treatment with ice, rest, elevation.  I sent in a prescription for tramadol and diclofenac.  I have asked her to follow-up with me in a couple weeks if she does not feel any improvement in her symptoms.  Follow-Up Instructions: Return if symptoms worsen or fail to improve.   Orders:  No orders of the defined types were placed in this encounter.  Meds ordered this encounter  Medications   traMADol (ULTRAM) 50 MG tablet    Sig: Take 1-2 tablets (50-100 mg total) by mouth daily as needed.    Dispense:  20 tablet    Refill:  0   diclofenac (VOLTAREN) 75 MG EC tablet    Sig: Take 1 tablet (75 mg total) by mouth 2 (two) times daily.    Dispense:  30 tablet    Refill:  2      Procedures: No procedures performed   Clinical Data: No additional findings.   Subjective: Chief Complaint  Patient presents with   Left Elbow - New Patient (Initial Visit)   Right Elbow - New Patient (Initial Visit)    Makayla Aguilar is 55 year old female referral from [facility] urgent care for bilateral wrist and elbow pain status post mechanical fall onto outstretched hands yesterday.  She is evaluated at the urgent care and x-rays were obtained and there was concern for scaphoid fracture and left radial head fracture.  The patient is endorsing pain to the right wrist and  moderately severe pain to the left elbow.  She is unable to move her wrist and elbow due to the pain.  She does report some numbness and tingling swelling to both hands.   Review of Systems  Constitutional: Negative.   HENT: Negative.    Eyes: Negative.   Respiratory: Negative.    Cardiovascular: Negative.   Endocrine: Negative.   Musculoskeletal: Negative.   Neurological: Negative.   Hematological: Negative.   Psychiatric/Behavioral: Negative.    All other systems reviewed and are negative.   Objective: Vital Signs: There were no vitals taken for this visit.  Physical Exam Vitals and nursing note reviewed.  Constitutional:      Appearance: She is well-developed.  Pulmonary:     Effort: Pulmonary effort is normal.  Skin:    General: Skin is warm.     Capillary Refill: Capillary refill takes less than 2 seconds.  Neurological:     Mental Status: She is alert and oriented to person, place, and time.  Psychiatric:        Behavior: Behavior normal.        Thought Content: Thought content normal.        Judgment:  Judgment normal.    Ortho Exam  Examination of bilateral wrists show guarding with attempted passive range of motion.  She mainly has tenderness to the base of the palms.  No tenderness in the snuffbox.  No neurovascular compromise.  Motor and sensory functions are grossly intact.  Examination of the left elbow shows tenderness to the lateral aspect of the elbow and the radial head.  There is guarding with attempted passive range of motion.  Examination of the right elbow is nonfocal but she does guard quite a bit to range of motion as well.  Specialty Comments:  No specialty comments available.  Imaging: No results found.   PMFS History: Patient Active Problem List   Diagnosis Date Noted   Obesity (BMI 35.0-39.9 without comorbidity) 02/27/2019   Essential hypertension 08/27/2018   Other fatigue 08/27/2018   Sinus pressure 04/25/2018   Acute recurrent  frontal sinusitis 04/25/2018   Viral infection 02/19/2018   Mass of thyroid region 02/19/2018   Non-seasonal allergic rhinitis 02/19/2018   Increased thirst 02/19/2018   Elevated blood pressure reading without diagnosis of hypertension 02/19/2018   Past Medical History:  Diagnosis Date   Hypertension    OSA on CPAP    Sleep apnea     Family History  Problem Relation Age of Onset   Alcohol abuse Mother    Diabetes Father     Past Surgical History:  Procedure Laterality Date   ABDOMINAL HYSTERECTOMY     Social History   Occupational History   Not on file  Tobacco Use   Smoking status: Former    Packs/day: 0.50    Types: Cigarettes    Quit date: 02/01/2019    Years since quitting: 1.7   Smokeless tobacco: Never  Substance and Sexual Activity   Alcohol use: Not Currently   Drug use: Not Currently   Sexual activity: Not on file

## 2020-11-17 ENCOUNTER — Telehealth: Payer: Self-pay | Admitting: Orthopaedic Surgery

## 2020-11-17 ENCOUNTER — Other Ambulatory Visit: Payer: Self-pay | Admitting: Physician Assistant

## 2020-11-17 MED ORDER — HYDROCODONE-ACETAMINOPHEN 5-325 MG PO TABS: ORAL_TABLET | ORAL | 0 refills | Status: DC

## 2020-11-17 NOTE — Telephone Encounter (Signed)
Sent in norco.  Allergy says it will make her high but that is the next strongest medicine.  If norco makes her high, anything stronger will also.    If still in that much pain, may need to come in to be reevaluated

## 2020-11-17 NOTE — Telephone Encounter (Signed)
Pts daughter Lequita Halt called stating the pt said her voltaren 75 mg and tramadol 50 mg rxs hasn't been helping with her pain and she would like to have something stronger called in. Pt also stated she has some bruising on her arm that wasn't there at her last appt, and wanted to know if she should make another appt to address it? Lastly pt would like to have her work note extended since she's still in pain and her job has a lot of physical labor. Pt would like a CB to address everything and if her work note can be extended she would like it emailed.   [email or web address] (daughters)  [phone]

## 2020-11-18 ENCOUNTER — Telehealth: Payer: Self-pay | Admitting: Orthopaedic Surgery

## 2020-11-18 NOTE — Telephone Encounter (Signed)
Ok to keep her out for a couple more weeks, but need to come back in and see [place] if in so much pain.  See previous message for other response

## 2020-11-18 NOTE — Telephone Encounter (Signed)
Pt also stated she has some bruising on her arm that wasn't there at her last appt, and wanted to know if she should make another appt to address it? Lastly pt would like to have her work note extended since she's still in pain and her job has a lot of physical labor. Pt would like a CB to address everything and if her work note can be extended she would like it emailed.    [email or web address] (daughters)   [phone]

## 2020-11-19 NOTE — Telephone Encounter (Signed)
Can you advise on 2nd part of msg.   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 Please advise

## 2020-11-19 NOTE — Telephone Encounter (Signed)
2nd part of msg sent to lindsey

## 2020-11-20 DIAGNOSIS — G4733 Obstructive sleep apnea (adult) (pediatric): Secondary | ICD-10-CM | POA: Diagnosis not present

## 2020-11-20 NOTE — Telephone Encounter (Signed)
Until f/u appointment

## 2020-11-20 NOTE — Telephone Encounter (Signed)
Work note emailed to [employer] below. Also this was sent to her Mychart.

## 2020-11-20 NOTE — Telephone Encounter (Signed)
Out of work for how long?

## 2020-11-20 NOTE — Telephone Encounter (Signed)
Ok to do

## 2020-11-20 NOTE — Telephone Encounter (Signed)
No f/u appt scheduled. Per Mardella Layman ok to Be OOW for another 2 weeks. But needs to follow up. Cannot keep writing her out of work and she's not following up.

## 2020-11-20 NOTE — Telephone Encounter (Signed)
Called Patient. She is aware. Also made her a follow up appt

## 2020-12-04 ENCOUNTER — Ambulatory Visit (INDEPENDENT_AMBULATORY_CARE_PROVIDER_SITE_OTHER): Payer: Worker's Compensation

## 2020-12-04 ENCOUNTER — Ambulatory Visit: Payer: Self-pay

## 2020-12-04 ENCOUNTER — Encounter: Payer: Self-pay | Admitting: Orthopaedic Surgery

## 2020-12-04 ENCOUNTER — Ambulatory Visit (INDEPENDENT_AMBULATORY_CARE_PROVIDER_SITE_OTHER): Payer: Medicaid Other | Admitting: Orthopaedic Surgery

## 2020-12-04 ENCOUNTER — Other Ambulatory Visit: Payer: Self-pay

## 2020-12-04 DIAGNOSIS — M25532 Pain in left wrist: Secondary | ICD-10-CM | POA: Diagnosis not present

## 2020-12-04 DIAGNOSIS — M25531 Pain in right wrist: Secondary | ICD-10-CM | POA: Diagnosis not present

## 2020-12-04 MED ORDER — PREDNISONE 10 MG (21) PO TBPK: ORAL_TABLET | ORAL | 0 refills | Status: DC

## 2020-12-04 NOTE — Progress Notes (Signed)
Office Visit Note   Patient: Makayla Aguilar           Date of Birth: 10/08/1965           MRN: [id number] Visit Date: 12/04/2020              Requested by: [name], FNP [address] [place],  [place] [address] PCP: [name], FNP   Assessment & Plan: Visit Diagnoses:  1. Bilateral wrist pain     Plan: Impression is continued bilateral wrist pain left greater than right following mechanical fall to both upper extremities on 11/10/2020.  X-rays are still unremarkable.  I would like to continue mobilizing both wrists in a Velcro splint.  I will also start her on a steroid taper.  Should her symptoms not continue to improve over the next 4 weeks she will follow-up for repeat evaluation.  Call with concerns or questions in the meantime.  Follow-Up Instructions: Return if symptoms worsen or fail to improve.   Orders:  Orders Placed This Encounter  Procedures   XR Wrist Complete Left   XR Wrist Complete Right   Meds ordered this encounter  Medications   predniSONE (STERAPRED UNI-PAK 21 TAB) 10 MG (21) TBPK tablet    Sig: Take as directed    Dispense:  21 tablet    Refill:  0       Procedures: No procedures performed   Clinical Data: No additional findings.   Subjective: Chief Complaint  Patient presents with   Left Elbow - Follow-up    HPI patient is a pleasant 55 year old female who comes in today with continued bilateral wrist pain.  This began after sustaining a mechanical fall landing on both wrists and elbows.  This occurred on 1011 2.  There is initial concern for nondisplaced left radial head fracture, but rather has been doing well.  She does however have continued pain to both wrists specifically over the scaphoid.  She has been wearing wrist splints and has been taking diclofenac and Norco without relief.  Review of Systems as detailed in HPI.  All others reviewed are negative.   Objective: Vital Signs: There were no vitals taken for  this visit.  Physical Exam well-developed well-nourished female no acute distress.  Alert and oriented x3.  Ortho Exam bilateral wrist exam shows no swelling and no ecchymosis.  She does exhibit guarding but appears to have tenderness throughout the base of the thumb and snuffbox.  No tenderness to the distal radius, first dorsal compartment or distal ulna.  Slight increased pain with wrist extension.  She is neurovascular intact distally.  Specialty Comments:  No specialty comments available.  Imaging: XR Wrist Complete Left  Result Date: 12/04/2020 No acute or structural abnormalities  XR Wrist Complete Right  Result Date: 12/04/2020 No acute or structural abnormalities    PMFS History: Patient Active Problem List   Diagnosis Date Noted   Obesity (BMI 35.0-39.9 without comorbidity) 02/27/2019   Essential hypertension 08/27/2018   Other fatigue 08/27/2018   Sinus pressure 04/25/2018   Acute recurrent frontal sinusitis 04/25/2018   Viral infection 02/19/2018   Mass of thyroid region 02/19/2018   Non-seasonal allergic rhinitis 02/19/2018   Increased thirst 02/19/2018   Elevated blood pressure reading without diagnosis of hypertension 02/19/2018   Past Medical History:  Diagnosis Date   Hypertension    OSA on CPAP    Sleep apnea     Family History  Problem Relation Age of Onset  Alcohol abuse Mother    Diabetes Father     Past Surgical History:  Procedure Laterality Date   ABDOMINAL HYSTERECTOMY     Social History   Occupational History   Not on file  Tobacco Use   Smoking status: Former    Packs/day: 0.50    Types: Cigarettes    Quit date: 02/01/2019    Years since quitting: 1.8   Smokeless tobacco: Never  Substance and Sexual Activity   Alcohol use: Not Currently   Drug use: Not Currently   Sexual activity: Not on file

## 2020-12-12 ENCOUNTER — Telehealth: Payer: Medicaid Other | Admitting: Nurse Practitioner

## 2020-12-12 DIAGNOSIS — H109 Unspecified conjunctivitis: Secondary | ICD-10-CM

## 2020-12-12 MED ORDER — POLYMYXIN B-TRIMETHOPRIM 10000-0.1 UNIT/ML-% OP SOLN: 1.0000 [drp] | OPHTHALMIC | 0 refills | Status: DC

## 2020-12-12 NOTE — Patient Instructions (Signed)
  Makayla Aguilar, thank you for joining [name], NP for today's virtual visit.  While this provider is not your primary care provider (PCP), if your PCP is located in our provider database this encounter information will be shared with them immediately following your visit.  Consent: (Patient) Makayla Aguilar provided verbal consent for this virtual visit at the beginning of the encounter.  Current Medications:  Current Outpatient Medications:    trimethoprim-polymyxin b (POLYTRIM) ophthalmic solution, Place 1 drop into the left eye every 4 (four) hours., Disp: 10 mL, Rfl: 0   Acetaminophen (TYLENOL 8 HOUR PO), Take by mouth 2 (two) times daily as needed. OTC PRN (generic version), Disp: , Rfl:    diclofenac (VOLTAREN) 75 MG EC tablet, Take 1 tablet (75 mg total) by mouth 2 (two) times daily., Disp: 30 tablet, Rfl: 2   HYDROcodone-acetaminophen (NORCO) 5-325 MG tablet, Take 1/2-1 pill twice daily as needed for pain, Disp: 20 tablet, Rfl: 0   ibuprofen (ADVIL) 800 MG tablet, Take 1 tablet (800 mg total) by mouth every 8 (eight) hours as needed., Disp: 30 tablet, Rfl: 0   lidocaine (LIDODERM) 5 %, Place 1 patch onto the skin daily. Remove & Discard patch within 12 hours or as directed by MD, Disp: 30 patch, Rfl: 0   losartan (COZAAR) 25 MG tablet, Take 1 tablet (25 mg total) by mouth daily., Disp: 30 tablet, Rfl: 2   predniSONE (STERAPRED UNI-PAK 21 TAB) 10 MG (21) TBPK tablet, Take as directed, Disp: 21 tablet, Rfl: 0   traMADol (ULTRAM) 50 MG tablet, Take 1-2 tablets (50-100 mg total) by mouth daily as needed., Disp: 20 tablet, Rfl: 0   Medications ordered in this encounter:  Meds ordered this encounter  Medications   trimethoprim-polymyxin b (POLYTRIM) ophthalmic solution    Sig: Place 1 drop into the left eye every 4 (four) hours.    Dispense:  10 mL    Refill:  0    Order Specific Question:   Supervising Provider    Answer:   Hyacinth Meeker, BRIAN [[id number]]     *If you need refills on  other medications prior to your next appointment, please contact your pharmacy*  Follow-Up: Call back or seek an in-person evaluation if the symptoms worsen or if the condition fails to improve as anticipated.  Other Instructions Apply warm compresses to eye as needed for relief    If you have been instructed to have an in-person evaluation today at a local Urgent Care facility, please use the link below. It will take you to a list of all of our available [facility], including address, phone number and hours of operation. Please do not delay care.  [facility]  If you or a family member do not have a primary care provider, use the link below to schedule a visit and establish care. When you choose a [facility] primary care physician or advanced practice provider, you gain a long-term partner in health. Find a Primary Care Provider  Learn more about [facility]'s in-office and virtual care options: [facility] - Get Care Now

## 2020-12-12 NOTE — Progress Notes (Signed)
Virtual Visit Consent   Makayla Aguilar, you are scheduled for a virtual visit with a [facility] provider today.     Just as with appointments in the office, your consent must be obtained to participate.  Your consent will be active for this visit and any virtual visit you may have with one of our providers in the next 365 days.     If you have a MyChart account, a copy of this consent can be sent to you electronically.  All virtual visits are billed to your insurance company just like a traditional visit in the office.    As this is a virtual visit, video technology does not allow for your provider to perform a traditional examination.  This may limit your provider's ability to fully assess your condition.  If your provider identifies any concerns that need to be evaluated in person or the need to arrange testing (such as labs, EKG, etc.), we will make arrangements to do so.     Although advances in technology are sophisticated, we cannot ensure that it will always work on either your end or our end.  If the connection with a video visit is poor, the visit may have to be switched to a telephone visit.  With either a video or telephone visit, we are not always able to ensure that we have a secure connection.     I need to obtain your verbal consent now.   Are you willing to proceed with your visit today?    BLAKELEIGH DOMEK has provided verbal consent on 12/12/2020 for a virtual visit (video or telephone).   [name], NP   Date: 12/12/2020 8:56 AM   Virtual Visit via Video Note   I, [name], connected with  Makayla Aguilar  ([id number], 1965-06-07) on 12/12/20 at  8:30 AM EST by a video-enabled telemedicine application and verified that I am speaking with the correct person using two identifiers.  Location: Patient: Virtual Visit Location Patient: Home Provider: Virtual Visit Location Provider: Home Office   I discussed the limitations of evaluation and management by  telemedicine and the availability of in person appointments. The patient expressed understanding and agreed to proceed.    History of Present Illness: Makayla Aguilar is a 55 y.o. who identifies as a female who was assigned female at birth, and is being seen today for bacterial conjunctivitis.  HPI:  Symptoms started 2 days ago. Left eye itching, painful, swollen and with yellow crusted drainage. Denies any visual disturbances.    Problems:  Patient Active Problem List   Diagnosis Date Noted   Obesity (BMI 35.0-39.9 without comorbidity) 02/27/2019   Essential hypertension 08/27/2018   Other fatigue 08/27/2018   Sinus pressure 04/25/2018   Acute recurrent frontal sinusitis 04/25/2018   Viral infection 02/19/2018   Mass of thyroid region 02/19/2018   Non-seasonal allergic rhinitis 02/19/2018   Increased thirst 02/19/2018   Elevated blood pressure reading without diagnosis of hypertension 02/19/2018    Allergies:  Allergies  Allergen Reactions   Hydrocodone     Gets really high   Medications:  Current Outpatient Medications:    trimethoprim-polymyxin b (POLYTRIM) ophthalmic solution, Place 1 drop into the left eye every 4 (four) hours., Disp: 10 mL, Rfl: 0   Acetaminophen (TYLENOL 8 HOUR PO), Take by mouth 2 (two) times daily as needed. OTC PRN (generic version), Disp: , Rfl:    diclofenac (VOLTAREN) 75 MG EC tablet, Take 1 tablet (75  mg total) by mouth 2 (two) times daily., Disp: 30 tablet, Rfl: 2   HYDROcodone-acetaminophen (NORCO) 5-325 MG tablet, Take 1/2-1 pill twice daily as needed for pain, Disp: 20 tablet, Rfl: 0   ibuprofen (ADVIL) 800 MG tablet, Take 1 tablet (800 mg total) by mouth every 8 (eight) hours as needed., Disp: 30 tablet, Rfl: 0   lidocaine (LIDODERM) 5 %, Place 1 patch onto the skin daily. Remove & Discard patch within 12 hours or as directed by MD, Disp: 30 patch, Rfl: 0   losartan (COZAAR) 25 MG tablet, Take 1 tablet (25 mg total) by mouth daily., Disp: 30  tablet, Rfl: 2   predniSONE (STERAPRED UNI-PAK 21 TAB) 10 MG (21) TBPK tablet, Take as directed, Disp: 21 tablet, Rfl: 0   traMADol (ULTRAM) 50 MG tablet, Take 1-2 tablets (50-100 mg total) by mouth daily as needed., Disp: 20 tablet, Rfl: 0  Observations/Objective: Patient is well-developed, well-nourished in no acute distress.  Resting comfortably  at home.  Head is normocephalic, atraumatic.  No labored breathing.  Speech is clear and coherent with logical content.  Patient is alert and oriented at baseline.  Left eye has significant purulent drainage in the inner canthus.   Assessment and Plan: 1. Bacterial conjunctivitis of left eye - trimethoprim-polymyxin b (POLYTRIM) ophthalmic solution; Place 1 drop into the left eye every 4 (four) hours.  Dispense: 10 mL; Refill: 0 Apply warm compresses to eye as needed for relief   Follow Up Instructions: I discussed the assessment and treatment plan with the patient. The patient was provided an opportunity to ask questions and all were answered. The patient agreed with the plan and demonstrated an understanding of the instructions.  A copy of instructions were sent to the patient via MyChart unless otherwise noted below.   The patient was advised to call back or seek an in-person evaluation if the symptoms worsen or if the condition fails to improve as anticipated.  Time:  I spent 10 minutes with the patient via telehealth technology discussing the above problems/concerns.    [name], NP

## 2020-12-15 ENCOUNTER — Telehealth: Payer: Self-pay

## 2020-12-15 NOTE — Telephone Encounter (Signed)
I left pt vm to call the office she needs to schedule an appt for her bp. Makayla Aguilar

## 2020-12-16 ENCOUNTER — Other Ambulatory Visit: Payer: Self-pay | Admitting: Nurse Practitioner

## 2020-12-16 ENCOUNTER — Encounter: Payer: Self-pay | Admitting: Nurse Practitioner

## 2020-12-16 MED ORDER — OLOPATADINE HCL 0.1 % OP SOLN: 1.0000 [drp] | Freq: Two times a day (BID) | OPHTHALMIC | 12 refills | Status: DC

## 2020-12-21 ENCOUNTER — Other Ambulatory Visit: Payer: Self-pay

## 2020-12-21 ENCOUNTER — Ambulatory Visit: Payer: Medicaid Other | Admitting: Nurse Practitioner

## 2020-12-21 ENCOUNTER — Encounter: Payer: Self-pay | Admitting: Nurse Practitioner

## 2020-12-21 VITALS — BP 156/95 | HR 85 | Temp 98.2°F | Ht 65.6 in | Wt 211.0 lb

## 2020-12-21 DIAGNOSIS — E7800 Pure hypercholesterolemia, unspecified: Secondary | ICD-10-CM | POA: Diagnosis not present

## 2020-12-21 DIAGNOSIS — I10 Essential (primary) hypertension: Secondary | ICD-10-CM | POA: Diagnosis not present

## 2020-12-21 DIAGNOSIS — H5789 Other specified disorders of eye and adnexa: Secondary | ICD-10-CM | POA: Diagnosis not present

## 2020-12-21 DIAGNOSIS — Z6834 Body mass index (BMI) 34.0-34.9, adult: Secondary | ICD-10-CM

## 2020-12-21 DIAGNOSIS — E66811 Obesity, class 1: Secondary | ICD-10-CM

## 2020-12-21 DIAGNOSIS — G4733 Obstructive sleep apnea (adult) (pediatric): Secondary | ICD-10-CM | POA: Diagnosis not present

## 2020-12-21 DIAGNOSIS — Z1159 Encounter for screening for other viral diseases: Secondary | ICD-10-CM

## 2020-12-21 DIAGNOSIS — Z2821 Immunization not carried out because of patient refusal: Secondary | ICD-10-CM | POA: Diagnosis not present

## 2020-12-21 DIAGNOSIS — E6609 Other obesity due to excess calories: Secondary | ICD-10-CM | POA: Diagnosis not present

## 2020-12-21 MED ORDER — LOSARTAN POTASSIUM 25 MG PO TABS: 25.0000 mg | ORAL_TABLET | Freq: Every day | ORAL | 1 refills | Status: DC

## 2020-12-21 NOTE — Patient Instructions (Signed)

## 2020-12-21 NOTE — Progress Notes (Signed)
I,Katawbba Wiggins,acting as a Education administrator for [employer], FNP.,have documented all relevant documentation on the behalf of Minette Brine, FNP,as directed by  Minette Brine, FNP while in the presence of Minette Brine, [place].   This visit occurred during the SARS-CoV-2 public health emergency.  Safety protocols were in place, including screening questions prior to the visit, additional usage of staff PPE, and extensive cleaning of exam room while observing appropriate contact time as indicated for disinfecting solutions.  Subjective:     Patient ID: Makayla Aguilar , female    DOB: 03-22-1965 , 55 y.o.   MRN: [id number]   Chief Complaint  Patient presents with   Hypertension    HPI  She is to go back to work in December after having a fall at work. She injured her arms and hands (safe transport). She does not check her blood pressure at home but does not check regularly  Wt Readings from Last 3 Encounters: 12/21/20 : 211 lb (95.7 kg) 03/05/20 : 240 lb (108.9 kg) 02/27/19 : 239 lb 12.8 oz (108.8 kg)  She states she is eating better since her daughter is staying with her and is a vegan.       Hypertension This is a chronic problem. The current episode started more than 1 year ago. The problem is uncontrolled. Pertinent negatives include no anxiety, chest pain, headaches or palpitations. Risk factors for coronary artery disease include obesity and sedentary lifestyle. Past treatments include angiotensin blockers. There are no compliance problems.  There is no history of chronic renal disease.    Past Medical History:  Diagnosis Date   Hypertension    OSA on CPAP    Sleep apnea      Family History  Problem Relation Age of Onset   Alcohol abuse Mother    Diabetes Father      Current Outpatient Medications:    ibuprofen (ADVIL) 800 MG tablet, Take 1 tablet (800 mg total) by mouth every 8 (eight) hours as needed. (Patient not taking: Reported on 12/21/2020), Disp: 30 tablet, Rfl: 0    lidocaine (LIDODERM) 5 %, Place 1 patch onto the skin daily. Remove & Discard patch within 12 hours or as directed by MD (Patient not taking: Reported on 12/21/2020), Disp: 30 patch, Rfl: 0   losartan (COZAAR) 25 MG tablet, Take 1 tablet (25 mg total) by mouth daily., Disp: 90 tablet, Rfl: 1   olopatadine (PATADAY) 0.1 % ophthalmic solution, Place 1 drop into both eyes 2 (two) times daily. (Patient not taking: Reported on 12/21/2020), Disp: 5 mL, Rfl: 12   Allergies  Allergen Reactions   Hydrocodone     Gets really high     Review of Systems  Constitutional: Negative.   Respiratory: Negative.    Cardiovascular:  Negative for chest pain, palpitations and leg swelling.  Neurological:  Negative for dizziness and headaches.  Psychiatric/Behavioral: Negative.      Today's Vitals   12/21/20 1408  BP: (!) 156/95  Pulse: 85  Temp: 98.2 F (36.8 C)  Weight: 211 lb (95.7 kg)  Height: 5' 5.6" (1.666 m)   Body mass index is 34.47 kg/m.  Wt Readings from Last 3 Encounters:  12/21/20 211 lb (95.7 kg)  03/05/20 240 lb (108.9 kg)  02/27/19 239 lb 12.8 oz (108.8 kg)    BP Readings from Last 3 Encounters:  12/21/20 (!) 156/95  09/11/20 (!) 186/99  03/05/20 (!) 162/88    Objective:  Physical Exam Vitals reviewed.  Constitutional:  General: She is not in acute distress.    Appearance: Normal appearance. She is well-developed. She is obese.  HENT:     Head: Normocephalic and atraumatic.  Eyes:     Conjunctiva/sclera:     Right eye: No exudate.    Left eye: Exudate (Yellow color) present.     Pupils: Pupils are equal, round, and reactive to light.  Cardiovascular:     Rate and Rhythm: Normal rate and regular rhythm.     Pulses: Normal pulses.     Heart sounds: Normal heart sounds. No murmur heard. Pulmonary:     Effort: Pulmonary effort is normal. No respiratory distress.     Breath sounds: Normal breath sounds. No wheezing.  Skin:    General: Skin is warm and dry.      Capillary Refill: Capillary refill takes less than 2 seconds.  Neurological:     General: No focal deficit present.     Mental Status: She is alert and oriented to person, place, and time.     Cranial Nerves: No cranial nerve deficit.     Motor: No weakness.  Psychiatric:        Mood and Affect: Mood normal.        Behavior: Behavior normal.        Thought Content: Thought content normal.        Judgment: Judgment normal.        Assessment And Plan:     1. Essential hypertension B/P is elevated, she had stopped her blood pressure medications and advised to not stop her medications, she is to return in 4 weeks for nurse visit blood pressure BMP ordered to check renal function.  The importance of regular exercise and dietary modification was stressed to the patient.  - BMP8+EGFR - losartan (COZAAR) 25 MG tablet; Take 1 tablet (25 mg total) by mouth daily.  Dispense: 90 tablet; Refill: 1  2. Elevated cholesterol Will check cholesterol level, was elevated at last visit - Lipid panel  3. Eye irritation Left eye with yellow discharge, she has used polymyxin without benefit, she had pataday sent in but has not picked up.  She will try this and call or send mychart message if not better.   4. Pneumococcal vaccination declined  5. Influenza vaccination declined Patient declined influenza vaccination at this time. Patient is aware that influenza vaccine prevents illness in 70% of healthy people, and reduces hospitalizations to 30-70% in elderly. This vaccine is recommended annually. Pt is willing to accept risk associated with refusing vaccination.  6. Tetanus, diphtheria, and acellular pertussis (Tdap) vaccination declined  7. Herpes zoster vaccination declined  8. Class 1 obesity due to excess calories with serious comorbidity and body mass index (BMI) of 34.0 to 34.9 in adult She is doing well has lost approximately 30 lbs since last visit She is encouraged to strive for BMI less  than 30 to decrease cardiac risk. Advised to aim for at least 150 minutes of exercise per week.   9. Encounter for hepatitis C screening test for low risk patient Will check Hepatitis C screening due to recent recommendations to screen all adults 18 years and older - Hepatitis C antibody    Patient was given opportunity to ask questions. Patient verbalized understanding of the plan and was able to repeat key elements of the plan. All questions were answered to their satisfaction.  Minette Brine, FNP   I, Minette Brine, FNP, have reviewed all documentation for this visit. The documentation  on 12/21/20 for the exam, diagnosis, procedures, and orders are all accurate and complete.   IF YOU HAVE BEEN REFERRED TO A SPECIALIST, IT MAY TAKE 1-2 WEEKS TO SCHEDULE/PROCESS THE REFERRAL. IF YOU HAVE NOT HEARD FROM US/SPECIALIST IN TWO WEEKS, PLEASE GIVE [place] A CALL AT [phone].   THE PATIENT IS ENCOURAGED TO PRACTICE SOCIAL DISTANCING DUE TO THE COVID-19 PANDEMIC.

## 2020-12-22 ENCOUNTER — Encounter: Payer: Self-pay | Admitting: Orthopaedic Surgery

## 2020-12-22 LAB — LIPID PANEL
Chol/HDL Ratio: 4.7 ratio — ABNORMAL HIGH (ref 0.0–4.4)
Cholesterol, Total: 217 mg/dL — ABNORMAL HIGH (ref 100–199)
HDL: 46 mg/dL (ref >39)
LDL Chol Calc (NIH): 147 mg/dL — ABNORMAL HIGH (ref 0–99)
Triglycerides: 132 mg/dL (ref 0–149)
VLDL Cholesterol Cal: 24 mg/dL (ref 5–40)

## 2020-12-22 LAB — BMP8+EGFR
BUN/Creatinine Ratio: 14 (ref 9–23)
BUN: 13 mg/dL (ref 6–24)
CO2: 22 mmol/L (ref 20–29)
Calcium: 9.8 mg/dL (ref 8.7–10.2)
Chloride: 103 mmol/L (ref 96–106)
Creatinine, Ser: 0.94 mg/dL (ref 0.57–1.00)
Glucose: 86 mg/dL (ref 70–99)
Potassium: 3.8 mmol/L (ref 3.5–5.2)
Sodium: 141 mmol/L (ref 134–144)
eGFR: 71 mL/min/{1.73_m2} (ref >59)

## 2020-12-22 LAB — HEPATITIS C ANTIBODY: Hep C Virus Ab: 0.1 s/co ratio (ref 0.0–0.9)

## 2021-01-01 ENCOUNTER — Ambulatory Visit (INDEPENDENT_AMBULATORY_CARE_PROVIDER_SITE_OTHER): Payer: Worker's Compensation

## 2021-01-01 ENCOUNTER — Ambulatory Visit (INDEPENDENT_AMBULATORY_CARE_PROVIDER_SITE_OTHER): Payer: Worker's Compensation | Admitting: Orthopaedic Surgery

## 2021-01-01 ENCOUNTER — Ambulatory Visit: Payer: Self-pay

## 2021-01-01 ENCOUNTER — Other Ambulatory Visit: Payer: Self-pay

## 2021-01-01 DIAGNOSIS — M25531 Pain in right wrist: Secondary | ICD-10-CM

## 2021-01-01 DIAGNOSIS — M25532 Pain in left wrist: Secondary | ICD-10-CM | POA: Diagnosis not present

## 2021-01-01 NOTE — Progress Notes (Signed)
Office Visit Note   Patient: Makayla Aguilar           Date of Birth: 1965/11/30           MRN: [id number] Visit Date: 01/01/2021              Requested by: [name], FNP [address] [place],  [place] [address] PCP: [name], FNP   Assessment & Plan: Visit Diagnoses:  1. Bilateral wrist pain     Plan: [name] returns today for bilateral hand pain.  She continues to have tingling numbness and burning at the base of the palm bilaterally.  Diclofenac has not helped.  She has been wearing braces continuously.  Denies any numbness and tingling in the fingers.  Date of injury was 11/10/2020 which has been in about 6 weeks.  Bilateral hands show normal range of motion.  She is tender across the base of the palm.  No tenderness to the anatomic snuffbox is.  Negative carpal tunnel compressive signs.  Normal sensation in the fingertips.  Capillary refill normal.  We obtained new x-rays today which show are still negative for any bony abnormalities.  I feel that we need to get nerve conduction studies to check for carpal tunnel syndrome although her symptoms and exam are not classic for this.  We will keep her out of work for another 4 weeks.  Follow-up after the nerve conduction studies.  Follow-Up Instructions: No follow-ups on file.   Orders:  Orders Placed This Encounter  Procedures   XR Wrist Complete Left   XR Wrist Complete Right   Ambulatory referral to Physical Medicine Rehab   No orders of the defined types were placed in this encounter.     Procedures: No procedures performed   Clinical Data: No additional findings.   Subjective: Chief Complaint  Patient presents with   Right Hand - Pain   Left Hand - Pain    HPI  Review of Systems  Constitutional: Negative.   HENT: Negative.    Eyes: Negative.   Respiratory: Negative.    Cardiovascular: Negative.   Endocrine: Negative.   Musculoskeletal: Negative.   Neurological: Negative.    Hematological: Negative.   Psychiatric/Behavioral: Negative.    All other systems reviewed and are negative.   Objective: Vital Signs: There were no vitals taken for this visit.  Physical Exam Vitals and nursing note reviewed.  Constitutional:      Appearance: She is well-developed.  Pulmonary:     Effort: Pulmonary effort is normal.  Skin:    General: Skin is warm.     Capillary Refill: Capillary refill takes less than 2 seconds.  Neurological:     Mental Status: She is alert and oriented to person, place, and time.  Psychiatric:        Behavior: Behavior normal.        Thought Content: Thought content normal.        Judgment: Judgment normal.    Ortho Exam  Specialty Comments:  No specialty comments available.  Imaging: XR Wrist Complete Left  Result Date: 01/01/2021 No acute or structural abnormalities.  XR Wrist Complete Right  Result Date: 01/01/2021 No acute or structural abnormalities    PMFS History: Patient Active Problem List   Diagnosis Date Noted   Obesity (BMI 35.0-39.9 without comorbidity) 02/27/2019   Essential hypertension 08/27/2018   Other fatigue 08/27/2018   Sinus pressure 04/25/2018   Acute recurrent frontal sinusitis 04/25/2018   Viral  infection 02/19/2018   Mass of thyroid region 02/19/2018   Non-seasonal allergic rhinitis 02/19/2018   Increased thirst 02/19/2018   Elevated blood pressure reading without diagnosis of hypertension 02/19/2018   Past Medical History:  Diagnosis Date   Hypertension    OSA on CPAP    Sleep apnea     Family History  Problem Relation Age of Onset   Alcohol abuse Mother    Diabetes Father     Past Surgical History:  Procedure Laterality Date   ABDOMINAL HYSTERECTOMY     Social History   Occupational History   Not on file  Tobacco Use   Smoking status: Former    Packs/day: 0.50    Types: Cigarettes    Quit date: 02/01/2019    Years since quitting: 1.9   Smokeless tobacco: Never   Tobacco  comments:    She has started back smoking about 2 cigarettes a day  Substance and Sexual Activity   Alcohol use: Not Currently   Drug use: Not Currently   Sexual activity: Not on file

## 2021-01-04 ENCOUNTER — Other Ambulatory Visit: Payer: Self-pay

## 2021-01-04 ENCOUNTER — Encounter: Payer: Self-pay | Admitting: Nurse Practitioner

## 2021-01-04 ENCOUNTER — Ambulatory Visit: Payer: Medicaid Other | Admitting: Nurse Practitioner

## 2021-01-04 VITALS — BP 142/100 | HR 94 | Temp 98.9°F | Ht 65.6 in | Wt 219.0 lb

## 2021-01-04 DIAGNOSIS — Z111 Encounter for screening for respiratory tuberculosis: Secondary | ICD-10-CM | POA: Diagnosis not present

## 2021-01-04 DIAGNOSIS — I10 Essential (primary) hypertension: Secondary | ICD-10-CM | POA: Diagnosis not present

## 2021-01-04 DIAGNOSIS — Z139 Encounter for screening, unspecified: Secondary | ICD-10-CM | POA: Diagnosis not present

## 2021-01-04 DIAGNOSIS — Z23 Encounter for immunization: Secondary | ICD-10-CM | POA: Diagnosis not present

## 2021-01-04 NOTE — Progress Notes (Signed)
I,Makayla Aguilar,acting as a Neurosurgeon for [employer], FNP.,have documented all relevant documentation on the behalf of Makayla Felts, FNP,as directed by  Makayla Felts, FNP while in the presence of Makayla Felts, FNP.   This visit occurred during the SARS-CoV-2 public health emergency.  Safety protocols were in place, including screening questions prior to the visit, additional usage of staff PPE, and extensive cleaning of exam room while observing appropriate contact time as indicated for disinfecting solutions.  Subjective:     Patient ID: Makayla Aguilar , female    DOB: Apr 09, 1965 , 55 y.o.   MRN: [id number]   Chief Complaint  Patient presents with   TB skin test    HPI  Patient presents today for a TB Skin test for work.  She needs a health form completed for [facility]  Hypertension This is a new problem. The current episode started more than 1 year ago. The problem has been gradually improving since onset. The problem is uncontrolled. Pertinent negatives include no anxiety, chest pain, malaise/fatigue or palpitations. Risk factors for coronary artery disease include sedentary lifestyle and obesity. There is no history of chronic renal disease.    Past Medical History:  Diagnosis Date   Hypertension    OSA on CPAP    Sleep apnea      Family History  Problem Relation Age of Onset   Alcohol abuse Mother    Diabetes Father      Current Outpatient Medications:    losartan (COZAAR) 25 MG tablet, Take 1 tablet (25 mg total) by mouth daily., Disp: 90 tablet, Rfl: 1   ibuprofen (ADVIL) 800 MG tablet, Take 1 tablet (800 mg total) by mouth every 8 (eight) hours as needed., Disp: 30 tablet, Rfl: 0   lidocaine (LIDODERM) 5 %, Place 1 patch onto the skin daily. Remove & Discard patch within 12 hours or as directed by MD, Disp: 30 patch, Rfl: 0   olopatadine (PATADAY) 0.1 % ophthalmic solution, Place 1 drop into both eyes 2 (two) times daily., Disp: 5 mL, Rfl: 12    Allergies  Allergen Reactions   Hydrocodone     Gets really high     Review of Systems  Constitutional: Negative.  Negative for malaise/fatigue.  Respiratory: Negative.    Cardiovascular:  Negative for chest pain, palpitations and leg swelling.  Neurological:  Negative for dizziness.  Psychiatric/Behavioral: Negative.      Today's Vitals   01/04/21 1448  BP: (!) 142/100  Pulse: 94  Temp: 98.9 F (37.2 C)  Weight: 219 lb (99.3 kg)  Height: 5' 5.6" (1.666 m)  PainSc: 0-No pain   Body mass index is 35.78 kg/m.   Objective:  Physical Exam Vitals reviewed.  Constitutional:      General: She is not in acute distress.    Appearance: Normal appearance. She is well-developed. She is obese.  HENT:     Head: Normocephalic and atraumatic.  Eyes:     Pupils: Pupils are equal, round, and reactive to light.  Cardiovascular:     Rate and Rhythm: Normal rate and regular rhythm.     Pulses: Normal pulses.     Heart sounds: Normal heart sounds. No murmur heard. Pulmonary:     Effort: Pulmonary effort is normal. No respiratory distress.     Breath sounds: Normal breath sounds. No wheezing.  Skin:    General: Skin is warm and dry.     Capillary Refill: Capillary refill takes less than 2  seconds.  Neurological:     General: No focal deficit present.     Mental Status: She is alert and oriented to person, place, and time.     Cranial Nerves: No cranial nerve deficit.     Motor: No weakness.  Psychiatric:        Mood and Affect: Mood normal.        Behavior: Behavior normal.        Thought Content: Thought content normal.        Judgment: Judgment normal.        Assessment And Plan:     1. Essential hypertension Comments: Blood pressure is elevated today but is improved since her last visit. Continue to avoid caffeinated drinks.  2. Screening for tuberculosis Comments: Needs for employment - [employer]  3. Encounter for screening - Measles/Mumps/Rubella  Immunity  4. Encounter for immunization Will give tetanus vaccine today while in office. Refer to order management. TDAP will be administered to adults 76-34 years old every 10 years. - Tdap vaccine greater than or equal to 7yo IM    Patient was given opportunity to ask questions. Patient verbalized understanding of the plan and was able to repeat key elements of the plan. All questions were answered to their satisfaction.  Makayla Felts, FNP   I, Makayla Felts, FNP, have reviewed all documentation for this visit. The documentation on 01/04/21 for the exam, diagnosis, procedures, and orders are all accurate and complete.   IF YOU HAVE BEEN REFERRED TO A SPECIALIST, IT MAY TAKE 1-2 WEEKS TO SCHEDULE/PROCESS THE REFERRAL. IF YOU HAVE NOT HEARD FROM US/SPECIALIST IN TWO WEEKS, PLEASE GIVE [place] A CALL AT [phone].   THE PATIENT IS ENCOURAGED TO PRACTICE SOCIAL DISTANCING DUE TO THE COVID-19 PANDEMIC.

## 2021-01-06 LAB — QUANTIFERON-TB GOLD PLUS
QuantiFERON Mitogen Value: >10 IU/mL
QuantiFERON Nil Value: 0.01 IU/mL
QuantiFERON TB1 Ag Value: 0.02 IU/mL
QuantiFERON TB2 Ag Value: 0.02 IU/mL
QuantiFERON-TB Gold Plus: NEGATIVE

## 2021-01-06 LAB — MEASLES/MUMPS/RUBELLA IMMUNITY
MUMPS ABS, IGG: 130 AU/mL (ref >10.9)
RUBEOLA AB, IGG: 275 AU/mL (ref >16.4)
Rubella Antibodies, IGG: 3.05 index (ref >0.99)

## 2021-01-07 ENCOUNTER — Encounter: Payer: Self-pay | Admitting: Nurse Practitioner

## 2021-01-08 ENCOUNTER — Encounter: Payer: Self-pay | Admitting: Physical Medicine and Rehabilitation

## 2021-01-08 ENCOUNTER — Other Ambulatory Visit: Payer: Self-pay

## 2021-01-08 ENCOUNTER — Ambulatory Visit (INDEPENDENT_AMBULATORY_CARE_PROVIDER_SITE_OTHER): Payer: Worker's Compensation | Admitting: Physical Medicine and Rehabilitation

## 2021-01-08 DIAGNOSIS — R202 Paresthesia of skin: Secondary | ICD-10-CM | POA: Diagnosis not present

## 2021-01-08 NOTE — Progress Notes (Signed)
Pt state numbness ans tingling in both palms of her hands. Pt state pushing or doing any house work makes the pain worse. Pt state she right handed.  Numeric Pain Rating Scale and Functional Assessment Average Pain 5   In the last MONTH (on 0-10 scale) has pain interfered with the following?  1. General activity like being  able to carry out your everyday physical activities such as walking, climbing stairs, carrying groceries, or moving a chair?  Rating(8)   -BT, -Dye Allergies.

## 2021-01-08 NOTE — Progress Notes (Signed)
Makayla Aguilar - 55 y.o. female MRN [id number]  Date of birth: 1965/05/06  Office Visit Note: Visit Date: 01/08/2021 PCP: [name], FNP Referred by: Tarry Kos, MD  Subjective: Chief Complaint  Patient presents with   Left Hand - Numbness, Tingling   Right Hand - Numbness, Pain, Tingling   HPI:  Makayla Aguilar is a 55 y.o. female who comes in today at the request of Dr. Glee Arvin for electrodiagnostic study of the Bilateral upper extremities.  Patient is Right hand dominant.  She reports chronic worsening severe hand pain with numbness and tingling mainly in the palms without any specific digits.  She reports worsening with pushing and pulling and doing housework.  She has no symptoms of radiculitis or radiculopathy.  She is not diabetic.  She has had no prior electrodiagnostic studies.  ROS Otherwise per HPI.  Assessment & Plan: Visit Diagnoses:    ICD-10-CM   1. Paresthesia of skin  R20.2 NCV with EMG (electromyography)      Plan: Impression:  Essentially NORMAL electrodiagnostic study of both upper limbs.  There is no significant electrodiagnostic evidence of nerve entrapment, brachial plexopathy or cervical radiculopathy.    As you know, purely sensory or demyelinating radiculopathies and chemical radiculitis may not be detected with this particular electrodiagnostic study. **This electrodiagnostic study cannot rule out small fiber polyneuropathy and dysesthesias from central pain syndromes such as stroke or central pain sensitization syndromes such as fibromyalgia.  Myotomal referral pain from trigger points is also not excluded.  Recommendations: 1.  Follow-up with referring physician. 2.  Continue current management of symptoms.  Meds & Orders: No orders of the defined types were placed in this encounter.   Orders Placed This Encounter  Procedures   NCV with EMG (electromyography)    Follow-up: Return in about 2 weeks (around 01/22/2021) for  Glee Arvin, MD.    Procedures: No procedures performed  EMG & NCV Findings: All nerve conduction studies (as indicated in the following tables) were within normal limits.  Left vs. Right side comparison data for the ulnar motor nerve indicates abnormal L-R velocity difference (A Elbow-B Elbow, 18 m/s).  All remaining left vs. right side differences were within normal limits.    All examined muscles (as indicated in the following table) showed no evidence of electrical instability.    Impression:  Essentially NORMAL electrodiagnostic study of both upper limbs.  There is no significant electrodiagnostic evidence of nerve entrapment, brachial plexopathy or cervical radiculopathy.    As you know, purely sensory or demyelinating radiculopathies and chemical radiculitis may not be detected with this particular electrodiagnostic study. **This electrodiagnostic study cannot rule out small fiber polyneuropathy and dysesthesias from central pain syndromes such as stroke or central pain sensitization syndromes such as fibromyalgia.  Myotomal referral pain from trigger points is also not excluded.  Recommendations: 1.  Follow-up with referring physician. 2.  Continue current management of symptoms.  ___________________________ Naaman Plummer FAAPMR Board Certified, American Board of Physical Medicine and Rehabilitation    Nerve Conduction Studies Anti Sensory Summary Table   Stim Site NR Peak (ms) Norm Peak (ms) P-T Amp (V) Norm P-T Amp Site1 Site2 Delta-P (ms) Dist (cm) Vel (m/s) Norm Vel (m/s)  Left Median Acr Palm Anti Sensory (2nd Digit)  33.2C  Wrist    3.0 <3.6 32.6 >10 Wrist Palm 1.2 0.0    Palm    1.8 <2.0 7.2         Right Median Acr  Palm Anti Sensory (2nd Digit)  32.9C  Wrist    3.1 <3.6 42.8 >10 Wrist Palm 1.5 0.0    Palm    1.6 <2.0 24.0         Left Radial Anti Sensory (Base 1st Digit)  33.3C  Wrist    1.9 <3.1 33.7  Wrist Base 1st Digit 1.9 0.0    Right Radial Anti Sensory (Base 1st Digit)   33C  Wrist    2.0 <3.1 44.9  Wrist Base 1st Digit 2.0 0.0    Left Ulnar Anti Sensory (5th Digit)  33.5C  Wrist    3.2 <3.7 20.4 >15.0 Wrist 5th Digit 3.2 14.0 44 >38  Right Ulnar Anti Sensory (5th Digit)  33.2C  Wrist    3.0 <3.7 18.4 >15.0 Wrist 5th Digit 3.0 14.0 47 >38   Motor Summary Table   Stim Site NR Onset (ms) Norm Onset (ms) O-P Amp (mV) Norm O-P Amp Site1 Site2 Delta-0 (ms) Dist (cm) Vel (m/s) Norm Vel (m/s)  Left Median Motor (Abd Poll Brev)  33.3C  Wrist    3.0 <4.2 5.9 >5 Elbow Wrist 4.3 22.5 52 >50  Elbow    7.3  5.7         Right Median Motor (Abd Poll Brev)  33.2C  Wrist    3.0 <4.2 6.6 >5 Elbow Wrist 4.6 23.0 50 >50  Elbow    7.6  6.5         Left Ulnar Motor (Abd Dig Min)  33.3C  Wrist    3.1 <4.2 9.1 >3 B Elbow Wrist 4.0 21.5 54 >53  B Elbow    7.1  8.5  A Elbow B Elbow 1.9 10.0 53 >53  A Elbow    9.0  7.9         Right Ulnar Motor (Abd Dig Min)  33.3C  Wrist    2.7 <4.2 9.5 >3 B Elbow Wrist 4.0 23.0 58 >53  B Elbow    6.7  10.1  A Elbow B Elbow 1.4 10.0 71 >53  A Elbow    8.1  9.7          EMG   Side Muscle Nerve Root Ins Act Fibs Psw Amp Dur Poly Recrt Int Dennie Bible Comment  Left Abd Poll Brev Median C8-T1 Nml Nml Nml Nml Nml 0 Nml Nml   Left 1stDorInt Ulnar C8-T1 Nml Nml Nml Nml Nml 0 Nml Nml   Left PronatorTeres Median C6-7 Nml Nml Nml Nml Nml 0 Nml Nml   Left Biceps Musculocut C5-6 Nml Nml Nml Nml Nml 0 Nml Nml   Left Deltoid Axillary C5-6 Nml Nml Nml Nml Nml 0 Nml Nml     Nerve Conduction Studies Anti Sensory Left/Right Comparison   Stim Site L Lat (ms) R Lat (ms) L-R Lat (ms) L Amp (V) R Amp (V) L-R Amp (%) Site1 Site2 L Vel (m/s) R Vel (m/s) L-R Vel (m/s)  Median Acr Palm Anti Sensory (2nd Digit)  33.2C  Wrist 3.0 3.1 0.1 32.6 42.8 23.8 Wrist Palm     Palm 1.8 1.6 0.2 7.2 24.0 70.0       Radial Anti Sensory (Base 1st Digit)  33.3C  Wrist 1.9 2.0 0.1 33.7 44.9 24.9 Wrist Base 1st Digit     Ulnar Anti Sensory (5th Digit)  33.5C  Wrist 3.2  3.0 0.2 20.4 18.4 9.8 Wrist 5th Digit 44 47 3   Motor Left/Right Comparison   Stim Site L Lat (ms)  R Lat (ms) L-R Lat (ms) L Amp (mV) R Amp (mV) L-R Amp (%) Site1 Site2 L Vel (m/s) R Vel (m/s) L-R Vel (m/s)  Median Motor (Abd Poll Brev)  33.3C  Wrist 3.0 3.0 0.0 5.9 6.6 10.6 Elbow Wrist 52 50 2  Elbow 7.3 7.6 0.3 5.7 6.5 12.3       Ulnar Motor (Abd Dig Min)  33.3C  Wrist 3.1 2.7 0.4 9.1 9.5 4.2 B Elbow Wrist 54 58 4  B Elbow 7.1 6.7 0.4 8.5 10.1 15.8 A Elbow B Elbow 53 71 *18  A Elbow 9.0 8.1 0.9 7.9 9.7 18.6          Waveforms:                     Clinical History: No specialty comments available.     Objective:  VS:  HT:    WT:   BMI:     BP:   HR: bpm  TEMP: ( )  RESP:  Physical Exam Musculoskeletal:        General: No swelling, tenderness or deformity.     Comments: Inspection reveals no atrophy of the bilateral APB or FDI or hand intrinsics. There is no swelling, color changes, allodynia or dystrophic changes. There is 5 out of 5 strength in the bilateral wrist extension, finger abduction and long finger flexion. There is intact sensation to light touch in all dermatomal and peripheral nerve distributions. There is a negative Phalen's test bilaterally. There is a negative Hoffmann's test bilaterally.  Skin:    General: Skin is warm and dry.     Findings: No erythema or rash.  Neurological:     General: No focal deficit present.     Mental Status: She is alert and oriented to person, place, and time.     Motor: No weakness or abnormal muscle tone.     Coordination: Coordination normal.  Psychiatric:        Mood and Affect: Mood normal.        Behavior: Behavior normal.     Imaging: No results found.

## 2021-01-12 NOTE — Procedures (Signed)
EMG & NCV Findings: All nerve conduction studies (as indicated in the following tables) were within normal limits.  Left vs. Right side comparison data for the ulnar motor nerve indicates abnormal L-R velocity difference (A Elbow-B Elbow, 18 m/s).  All remaining left vs. right side differences were within normal limits.    All examined muscles (as indicated in the following table) showed no evidence of electrical instability.    Impression:  Essentially NORMAL electrodiagnostic study of both upper limbs.  There is no significant electrodiagnostic evidence of nerve entrapment, brachial plexopathy or cervical radiculopathy.    As you know, purely sensory or demyelinating radiculopathies and chemical radiculitis may not be detected with this particular electrodiagnostic study. **This electrodiagnostic study cannot rule out small fiber polyneuropathy and dysesthesias from central pain syndromes such as stroke or central pain sensitization syndromes such as fibromyalgia.  Myotomal referral pain from trigger points is also not excluded.  Recommendations: 1.  Follow-up with referring physician. 2.  Continue current management of symptoms.  ___________________________ Makayla Aguilar FAAPMR Board Certified, American Board of Physical Medicine and Rehabilitation    Nerve Conduction Studies Anti Sensory Summary Table   Stim Site NR Peak (ms) Norm Peak (ms) P-T Amp (V) Norm P-T Amp Site1 Site2 Delta-P (ms) Dist (cm) Vel (m/s) Norm Vel (m/s)  Left Median Acr Palm Anti Sensory (2nd Digit)  33.2C  Wrist    3.0 <3.6 32.6 >10 Wrist Palm 1.2 0.0    Palm    1.8 <2.0 7.2         Right Median Acr Palm Anti Sensory (2nd Digit)  32.9C  Wrist    3.1 <3.6 42.8 >10 Wrist Palm 1.5 0.0    Palm    1.6 <2.0 24.0         Left Radial Anti Sensory (Base 1st Digit)  33.3C  Wrist    1.9 <3.1 33.7  Wrist Base 1st Digit 1.9 0.0    Right Radial Anti Sensory (Base 1st Digit)  33C  Wrist    2.0 <3.1 44.9  Wrist Base 1st  Digit 2.0 0.0    Left Ulnar Anti Sensory (5th Digit)  33.5C  Wrist    3.2 <3.7 20.4 >15.0 Wrist 5th Digit 3.2 14.0 44 >38  Right Ulnar Anti Sensory (5th Digit)  33.2C  Wrist    3.0 <3.7 18.4 >15.0 Wrist 5th Digit 3.0 14.0 47 >38   Motor Summary Table   Stim Site NR Onset (ms) Norm Onset (ms) O-P Amp (mV) Norm O-P Amp Site1 Site2 Delta-0 (ms) Dist (cm) Vel (m/s) Norm Vel (m/s)  Left Median Motor (Abd Poll Brev)  33.3C  Wrist    3.0 <4.2 5.9 >5 Elbow Wrist 4.3 22.5 52 >50  Elbow    7.3  5.7         Right Median Motor (Abd Poll Brev)  33.2C  Wrist    3.0 <4.2 6.6 >5 Elbow Wrist 4.6 23.0 50 >50  Elbow    7.6  6.5         Left Ulnar Motor (Abd Dig Min)  33.3C  Wrist    3.1 <4.2 9.1 >3 B Elbow Wrist 4.0 21.5 54 >53  B Elbow    7.1  8.5  A Elbow B Elbow 1.9 10.0 53 >53  A Elbow    9.0  7.9         Right Ulnar Motor (Abd Dig Min)  33.3C  Wrist    2.7 <4.2 9.5 >3 B Elbow  Wrist 4.0 23.0 58 >53  B Elbow    6.7  10.1  A Elbow B Elbow 1.4 10.0 71 >53  A Elbow    8.1  9.7          EMG   Side Muscle Nerve Root Ins Act Fibs Psw Amp Dur Poly Recrt Int Dennie Bible Comment  Left Abd Poll Brev Median C8-T1 Nml Nml Nml Nml Nml 0 Nml Nml   Left 1stDorInt Ulnar C8-T1 Nml Nml Nml Nml Nml 0 Nml Nml   Left PronatorTeres Median C6-7 Nml Nml Nml Nml Nml 0 Nml Nml   Left Biceps Musculocut C5-6 Nml Nml Nml Nml Nml 0 Nml Nml   Left Deltoid Axillary C5-6 Nml Nml Nml Nml Nml 0 Nml Nml     Nerve Conduction Studies Anti Sensory Left/Right Comparison   Stim Site L Lat (ms) R Lat (ms) L-R Lat (ms) L Amp (V) R Amp (V) L-R Amp (%) Site1 Site2 L Vel (m/s) R Vel (m/s) L-R Vel (m/s)  Median Acr Palm Anti Sensory (2nd Digit)  33.2C  Wrist 3.0 3.1 0.1 32.6 42.8 23.8 Wrist Palm     Palm 1.8 1.6 0.2 7.2 24.0 70.0       Radial Anti Sensory (Base 1st Digit)  33.3C  Wrist 1.9 2.0 0.1 33.7 44.9 24.9 Wrist Base 1st Digit     Ulnar Anti Sensory (5th Digit)  33.5C  Wrist 3.2 3.0 0.2 20.4 18.4 9.8 Wrist 5th Digit 44 47 3    Motor Left/Right Comparison   Stim Site L Lat (ms) R Lat (ms) L-R Lat (ms) L Amp (mV) R Amp (mV) L-R Amp (%) Site1 Site2 L Vel (m/s) R Vel (m/s) L-R Vel (m/s)  Median Motor (Abd Poll Brev)  33.3C  Wrist 3.0 3.0 0.0 5.9 6.6 10.6 Elbow Wrist 52 50 2  Elbow 7.3 7.6 0.3 5.7 6.5 12.3       Ulnar Motor (Abd Dig Min)  33.3C  Wrist 3.1 2.7 0.4 9.1 9.5 4.2 B Elbow Wrist 54 58 4  B Elbow 7.1 6.7 0.4 8.5 10.1 15.8 A Elbow B Elbow 53 71 *18  A Elbow 9.0 8.1 0.9 7.9 9.7 18.6          Waveforms:

## 2021-01-19 ENCOUNTER — Ambulatory Visit: Payer: Medicaid Other

## 2021-01-20 ENCOUNTER — Ambulatory Visit: Payer: Medicaid Other | Admitting: Nurse Practitioner

## 2021-01-20 DIAGNOSIS — G4733 Obstructive sleep apnea (adult) (pediatric): Secondary | ICD-10-CM | POA: Diagnosis not present

## 2021-02-02 ENCOUNTER — Other Ambulatory Visit: Payer: Self-pay

## 2021-02-02 ENCOUNTER — Ambulatory Visit: Payer: Medicaid Other

## 2021-02-02 VITALS — BP 148/100 | HR 10 | Temp 98.8°F | Ht 65.0 in | Wt 211.2 lb

## 2021-02-02 DIAGNOSIS — I10 Essential (primary) hypertension: Secondary | ICD-10-CM

## 2021-02-02 NOTE — Progress Notes (Signed)
Pt presents today for BPC. She takes Losartan 25MG . She reports taking between 4 & 6am. She denies dizziness, blurred vision & headache. Pt reports drinking 3, 16.9ounce bottles a day, working on getting in 4 bottles a day. BP Readings from Last 3 Encounters:  02/02/21 (!) 142/100  01/04/21 (!) 142/100  12/21/20 (!) 156/95  I will take BP again in 5 mins. Pt told by provider to come back in 2 weeks for NV. Up dose to 50mg .

## 2021-02-16 ENCOUNTER — Ambulatory Visit: Payer: Medicaid Other

## 2021-02-16 ENCOUNTER — Other Ambulatory Visit: Payer: Self-pay

## 2021-02-16 VITALS — BP 132/76 | HR 96 | Temp 98.2°F | Ht 65.0 in | Wt 215.0 lb

## 2021-02-16 DIAGNOSIS — I10 Essential (primary) hypertension: Secondary | ICD-10-CM

## 2021-02-16 NOTE — Progress Notes (Signed)
Patient is here for bpc. At her last visit she was instructed to increase her losartan to 50mg .   BP Readings from Last 3 Encounters:  02/16/21 132/76  02/02/21 (!) 148/100  01/04/21 (!) 142/100

## 2021-03-09 ENCOUNTER — Encounter: Payer: Self-pay | Admitting: Nurse Practitioner

## 2021-03-09 ENCOUNTER — Ambulatory Visit (INDEPENDENT_AMBULATORY_CARE_PROVIDER_SITE_OTHER): Payer: Medicaid Other | Admitting: Nurse Practitioner

## 2021-03-09 ENCOUNTER — Other Ambulatory Visit: Payer: Self-pay

## 2021-03-09 VITALS — BP 160/100 | HR 73 | Temp 98.5°F | Ht 65.0 in | Wt 212.0 lb

## 2021-03-09 DIAGNOSIS — Z1231 Encounter for screening mammogram for malignant neoplasm of breast: Secondary | ICD-10-CM

## 2021-03-09 DIAGNOSIS — Z2821 Immunization not carried out because of patient refusal: Secondary | ICD-10-CM

## 2021-03-09 DIAGNOSIS — I10 Essential (primary) hypertension: Secondary | ICD-10-CM

## 2021-03-09 DIAGNOSIS — E7800 Pure hypercholesterolemia, unspecified: Secondary | ICD-10-CM | POA: Diagnosis not present

## 2021-03-09 DIAGNOSIS — Z9989 Dependence on other enabling machines and devices: Secondary | ICD-10-CM

## 2021-03-09 DIAGNOSIS — R0789 Other chest pain: Secondary | ICD-10-CM

## 2021-03-09 DIAGNOSIS — J029 Acute pharyngitis, unspecified: Secondary | ICD-10-CM | POA: Diagnosis not present

## 2021-03-09 DIAGNOSIS — E6609 Other obesity due to excess calories: Secondary | ICD-10-CM

## 2021-03-09 DIAGNOSIS — G4733 Obstructive sleep apnea (adult) (pediatric): Secondary | ICD-10-CM

## 2021-03-09 DIAGNOSIS — Z0000 Encounter for general adult medical examination without abnormal findings: Secondary | ICD-10-CM

## 2021-03-09 DIAGNOSIS — E669 Obesity, unspecified: Secondary | ICD-10-CM

## 2021-03-09 DIAGNOSIS — R1319 Other dysphagia: Secondary | ICD-10-CM

## 2021-03-09 DIAGNOSIS — Z720 Tobacco use: Secondary | ICD-10-CM

## 2021-03-09 LAB — POCT URINALYSIS DIPSTICK
Bilirubin, UA: NEGATIVE
Glucose, UA: NEGATIVE
Ketones, UA: NEGATIVE
Leukocytes, UA: NEGATIVE
Nitrite, UA: NEGATIVE
Protein, UA: POSITIVE — AB
Spec Grav, UA: 1.015 (ref 1.010–1.025)
Urobilinogen, UA: 0.2 E.U./dL
pH, UA: 7 (ref 5.0–8.0)

## 2021-03-09 LAB — POCT UA - MICROALBUMIN
Creatinine, POC: 200 mg/dL
Microalbumin Ur, POC: 80 mg/L

## 2021-03-09 LAB — POC COVID19 BINAXNOW: SARS Coronavirus 2 Ag: NEGATIVE

## 2021-03-09 MED ORDER — OLMESARTAN MEDOXOMIL 40 MG PO TABS: 40.0000 mg | ORAL_TABLET | Freq: Every day | ORAL | 2 refills | Status: DC

## 2021-03-09 MED ORDER — OMEPRAZOLE MAGNESIUM 20 MG PO TBEC: 20.0000 mg | DELAYED_RELEASE_TABLET | Freq: Every day | ORAL | 2 refills | Status: DC

## 2021-03-09 NOTE — Progress Notes (Signed)
I,Victoria T Hamilton,acting as a Education administrator for Minette Brine, FNP.,have documented all relevant documentation on the behalf of Minette Brine, FNP,as directed by  Minette Brine, FNP while in the presence of Minette Brine, [place].  This visit occurred during the SARS-CoV-2 public health emergency.  Safety protocols were in place, including screening questions prior to the visit, additional usage of staff PPE, and extensive cleaning of exam room while observing appropriate contact time as indicated for disinfecting solutions.  Subjective:     Patient ID: Makayla [facility] , female    DOB: October 15, 1965 , 56 y.o.   MRN: [id number]   Chief Complaint  Patient presents with   Annual Exam    HPI  Pt presents today for HM.   Pt report having chest pain, she states when she swallows she can feel it. She reports having worsening symptoms last week.  She also reports dry lips, 4 bottles of water a day.  When chest pain occurs , she cannot eat, drink, swallow. This has been on/ off, she does admit to chest pain getting worse. Last week it was on/off for a whole week.   Pt denies dizziness.    Past Medical History:  Diagnosis Date   Hypertension    OSA on CPAP    Sleep apnea      Family History  Problem Relation Age of Onset   Alcohol abuse Mother    Diabetes Father      Current Outpatient Medications:    ibuprofen (ADVIL) 800 MG tablet, Take 1 tablet (800 mg total) by mouth every 8 (eight) hours as needed., Disp: 30 tablet, Rfl: 0   olmesartan (BENICAR) 40 MG tablet, Take 1 tablet (40 mg total) by mouth daily., Disp: 30 tablet, Rfl: 2   omeprazole (PRILOSEC OTC) 20 MG tablet, Take 1 tablet (20 mg total) by mouth daily., Disp: 30 tablet, Rfl: 2   lidocaine (LIDODERM) 5 %, Place 1 patch onto the skin daily. Remove & Discard patch within 12 hours or as directed by MD (Patient not taking: Reported on 02/02/2021), Disp: 30 patch, Rfl: 0   olopatadine (PATADAY) 0.1 % ophthalmic solution, Place 1 drop into  both eyes 2 (two) times daily. (Patient not taking: Reported on 02/02/2021), Disp: 5 mL, Rfl: 12   Allergies  Allergen Reactions   Hydrocodone     Gets really high      The patient states she is status post hysterectomy. No LMP recorded. Patient has had a hysterectomy.. Negative for Dysmenorrhea and Negative for Menorrhagia. Negative for: breast discharge, breast lump(s), breast pain and breast self exam. Associated symptoms include abnormal vaginal bleeding. Pertinent negatives include abnormal bleeding (hematology), anxiety, decreased libido, depression, difficulty falling sleep, dyspareunia, history of infertility, nocturia, sexual dysfunction, sleep disturbances, urinary incontinence, urinary urgency, vaginal discharge and vaginal itching. Diet regular, she reports she is doing better with her diet. She feels like the more she does good the worse she is feeling. The patient states her exercise level is none.  The patient's tobacco use is:  Social History   Tobacco Use  Smoking Status Former   Packs/day: 0.50   Types: Cigarettes   Quit date: 02/01/2019   Years since quitting: 2.1  Smokeless Tobacco Never  Tobacco Comments   She has started back smoking about 2 cigarettes a day  . She has been exposed to passive smoke. The patient's alcohol use is:  Social History   Substance and Sexual Activity  Alcohol Use Not Currently  Review of Systems  Constitutional: Negative.   HENT: Negative.    Eyes: Negative.   Respiratory: Negative.    Cardiovascular: Negative.   Gastrointestinal: Negative.   Endocrine: Negative.   Genitourinary: Negative.   Musculoskeletal: Negative.   Skin: Negative.   Allergic/Immunologic: Negative.   Neurological: Negative.  Negative for dizziness and headaches.  Hematological: Negative.   Psychiatric/Behavioral: Negative.      Today's Vitals   03/09/21 1004 03/09/21 1057  BP: (!) 142/100 (!) 160/100  Pulse: (!) 107 73  Temp: 98.5 F (36.9 C)    Weight: 212 lb (96.2 kg)   Height: 5' 5"  (1.651 m)    Body mass index is 35.28 kg/m.  Wt Readings from Last 3 Encounters:  03/09/21 212 lb (96.2 kg)  02/16/21 215 lb (97.5 kg)  02/02/21 211 lb 3.2 oz (95.8 kg)    Objective:  Physical Exam Vitals reviewed.  Constitutional:      General: She is not in acute distress.    Appearance: Normal appearance. She is well-developed. She is obese.  HENT:     Head: Normocephalic and atraumatic.     Right Ear: Tympanic membrane, ear canal and external ear normal. There is no impacted cerumen.     Left Ear: Tympanic membrane, ear canal and external ear normal. There is no impacted cerumen.     Nose:     Comments: Deferred - masked     Mouth/Throat:     Comments: Deferred - masked Eyes:     Extraocular Movements: Extraocular movements intact.     Conjunctiva/sclera: Conjunctivae normal.     Pupils: Pupils are equal, round, and reactive to light.  Cardiovascular:     Rate and Rhythm: Normal rate and regular rhythm.     Pulses: Normal pulses.     Heart sounds: Normal heart sounds. No murmur heard. Pulmonary:     Effort: Pulmonary effort is normal. No respiratory distress.     Breath sounds: Normal breath sounds. No wheezing.  Abdominal:     General: Abdomen is flat. Bowel sounds are normal. There is no distension.     Palpations: Abdomen is soft. There is no mass.     Tenderness: There is no abdominal tenderness.  Musculoskeletal:        General: No tenderness. Normal range of motion.     Cervical back: Normal range of motion and neck supple. No tenderness.  Skin:    General: Skin is warm and dry.     Capillary Refill: Capillary refill takes less than 2 seconds.  Neurological:     General: No focal deficit present.     Mental Status: She is alert and oriented to person, place, and time.     Cranial Nerves: No cranial nerve deficit.     Motor: No weakness.  Psychiatric:        Mood and Affect: Mood normal.        Behavior: Behavior  normal.        Thought Content: Thought content normal.        Judgment: Judgment normal.        Assessment And Plan:     1. Health maintenance examination Behavior modifications discussed and diet history reviewed.   Pt will continue to exercise regularly and modify diet with low GI, plant based foods and decrease intake of processed foods.  Recommend intake of daily multivitamin, Vitamin D, and calcium.  Recommend mammogram and colonoscopy for preventive screenings, as well as recommend immunizations that  include influenza, TDAP, and Shingles - CMP14+EGFR - CBC - Hemoglobin A1c - Lipid panel  2. Uncontrolled hypertension Comments: Blood pressure remains elevated, will change to olmesartan 40 mg daily, return in 2 weeks for NV BP check. EKG done NSR HR 78, refer to Cardiology - CMP14+EGFR - CBC - Hemoglobin A1c - Lipid panel - EKG 12-Lead - POCT UA - Microalbumin - POCT Urinalysis Dipstick (81002) - olmesartan (BENICAR) 40 MG tablet; Take 1 tablet (40 mg total) by mouth daily.  Dispense: 30 tablet; Refill: 2  3. Elevated cholesterol Comments: Diet controlled, encouraged to eat a low fat diet.  - CMP14+EGFR - CBC - Hemoglobin A1c - Lipid panel  4. Obesity (BMI 35.0-39.9 without comorbidity) Chronic Discussed healthy diet and regular exercise options  Encouraged to exercise at least 150 minutes per week with 2 days of strength training She is encouraged to strive for BMI less than 30 to decrease cardiac risk. Advised to aim for at least 150 minutes of exercise per week.   5. Sore throat Comments: Negative rapid covid. Encouraged to do warm salt water gargles and hot tea with honey.  - POC COVID-19 BinaxNow  6. Other chest pain Comments: EKG done with NSR. She is to take omeprazole to see if this is effective. Will also refer to Cardiology and GI. - Ambulatory referral to Cardiology - omeprazole (PRILOSEC OTC) 20 MG tablet; Take 1 tablet (20 mg total) by mouth daily.   Dispense: 30 tablet; Refill: 2 - Ambulatory referral to Gastroenterology  7. Other dysphagia - omeprazole (PRILOSEC OTC) 20 MG tablet; Take 1 tablet (20 mg total) by mouth daily.  Dispense: 30 tablet; Refill: 2 - Ambulatory referral to Gastroenterology  8. COVID-19 vaccine dose declined Declines covid 19 vaccine. Discussed risk of covid 62 and if she changes her mind about the vaccine to call the office.  Encouraged to take multivitamin, vitamin d, vitamin c and zinc to increase immune system. Aware can call office if would like to have vaccine here at office.    9. Encounter for screening mammogram for breast cancer Pt instructed on Self Breast Exam.According to ACOG guidelines Women aged 78 and older are recommended to get an annual mammogram. Form completed and given to patient contact the [facility] for appointment scheduing.  Pt encouraged to get annual mammogram - MM Digital Screening; Future     Patient was given opportunity to ask questions. Patient verbalized understanding of the plan and was able to repeat key elements of the plan. All questions were answered to their satisfaction.   Minette Brine, FNP   I, Minette Brine, FNP, have reviewed all documentation for this visit. The documentation on 03/09/21 for the exam, diagnosis, procedures, and orders are all accurate and complete.  THE PATIENT IS ENCOURAGED TO PRACTICE SOCIAL DISTANCING DUE TO THE COVID-19 PANDEMIC.

## 2021-03-09 NOTE — Patient Instructions (Signed)

## 2021-03-10 LAB — LIPID PANEL
Chol/HDL Ratio: 4.5 ratio — ABNORMAL HIGH (ref 0.0–4.4)
Cholesterol, Total: 196 mg/dL (ref 100–199)
HDL: 44 mg/dL (ref >39)
LDL Chol Calc (NIH): 117 mg/dL — ABNORMAL HIGH (ref 0–99)
Triglycerides: 197 mg/dL — ABNORMAL HIGH (ref 0–149)
VLDL Cholesterol Cal: 35 mg/dL (ref 5–40)

## 2021-03-10 LAB — CMP14+EGFR
ALT: 13 IU/L (ref 0–32)
AST: 10 IU/L (ref 0–40)
Albumin/Globulin Ratio: 1.7 (ref 1.2–2.2)
Albumin: 4.3 g/dL (ref 3.8–4.9)
Alkaline Phosphatase: 88 IU/L (ref 44–121)
BUN/Creatinine Ratio: 13 (ref 9–23)
BUN: 14 mg/dL (ref 6–24)
Bilirubin Total: <0.2 mg/dL (ref 0.0–1.2)
CO2: 23 mmol/L (ref 20–29)
Calcium: 9.4 mg/dL (ref 8.7–10.2)
Chloride: 104 mmol/L (ref 96–106)
Creatinine, Ser: 1.05 mg/dL — ABNORMAL HIGH (ref 0.57–1.00)
Globulin, Total: 2.5 g/dL (ref 1.5–4.5)
Glucose: 79 mg/dL (ref 70–99)
Potassium: 4.5 mmol/L (ref 3.5–5.2)
Sodium: 141 mmol/L (ref 134–144)
Total Protein: 6.8 g/dL (ref 6.0–8.5)
eGFR: 62 mL/min/{1.73_m2} (ref >59)

## 2021-03-10 LAB — CBC
Hematocrit: 40.5 % (ref 34.0–46.6)
Hemoglobin: 13.6 g/dL (ref 11.1–15.9)
MCH: 30.2 pg (ref 26.6–33.0)
MCHC: 33.6 g/dL (ref 31.5–35.7)
MCV: 90 fL (ref 79–97)
Platelets: 374 10*3/uL (ref 150–450)
RBC: 4.51 x10E6/uL (ref 3.77–5.28)
RDW: 13.4 % (ref 11.7–15.4)
WBC: 10.3 10*3/uL (ref 3.4–10.8)

## 2021-03-10 LAB — HEMOGLOBIN A1C
Est. average glucose Bld gHb Est-mCnc: 117 mg/dL
Hgb A1c MFr Bld: 5.7 % — ABNORMAL HIGH (ref 4.8–5.6)

## 2021-03-14 DIAGNOSIS — H5213 Myopia, bilateral: Secondary | ICD-10-CM | POA: Diagnosis not present

## 2021-03-19 ENCOUNTER — Ambulatory Visit: Payer: Medicaid Other | Admitting: Cardiovascular Disease

## 2021-03-19 ENCOUNTER — Other Ambulatory Visit: Payer: Self-pay

## 2021-03-19 ENCOUNTER — Encounter: Payer: Self-pay | Admitting: Cardiovascular Disease

## 2021-03-19 VITALS — BP 156/96 | HR 82 | Ht 65.0 in | Wt 216.2 lb

## 2021-03-19 DIAGNOSIS — R072 Precordial pain: Secondary | ICD-10-CM

## 2021-03-19 DIAGNOSIS — G4733 Obstructive sleep apnea (adult) (pediatric): Secondary | ICD-10-CM | POA: Diagnosis not present

## 2021-03-19 DIAGNOSIS — R0609 Other forms of dyspnea: Secondary | ICD-10-CM

## 2021-03-19 DIAGNOSIS — R0789 Other chest pain: Secondary | ICD-10-CM

## 2021-03-19 DIAGNOSIS — I10 Essential (primary) hypertension: Secondary | ICD-10-CM

## 2021-03-19 MED ORDER — METOPROLOL TARTRATE 100 MG PO TABS: 100.0000 mg | ORAL_TABLET | Freq: Once | ORAL | 0 refills | Status: DC

## 2021-03-19 NOTE — Assessment & Plan Note (Signed)
History of obstructive sleep apnea on CPAP. 

## 2021-03-19 NOTE — Assessment & Plan Note (Signed)
History of progressive dyspnea on exertion over the last several months.  She does have a 40-pack-year history tobacco abuse currently smoking 1/2 pack/day with an interest to stop.  I am going to get a 2D echo as well to further evaluate

## 2021-03-19 NOTE — Patient Instructions (Addendum)
Medication Instructions:  Your physician recommends that you continue on your current medications as directed. Please refer to the Current Medication list given to you today.  *If you need a refill on your cardiac medications before your next appointment, please call your pharmacy*   Lab Work: Your physician recommends that you return for lab work in: within 7 days of coronary CTA- FASTING lipid/liver and BMET  If you have labs (blood work) drawn today and your tests are completely normal, you will receive your results only by: MyChart Message (if you have MyChart) OR A paper copy in the mail If you have any lab test that is abnormal or we need to change your treatment, we will call you to review the results.   Testing/Procedures: Your physician has requested that you have an echocardiogram. Echocardiography is a painless test that uses sound waves to create images of your heart. It provides your doctor with information about the size and shape of your heart and how well your hearts chambers and valves are working. This procedure takes approximately one hour. There are no restrictions for this procedure. This procedure is done at [address] [place]   Follow-Up: At [facility], you and your health needs are our priority.  As part of our continuing mission to provide you with exceptional heart care, we have created designated Provider Care Teams.  These Care Teams include your primary Cardiologist (physician) and Advanced Practice Providers (APPs -  Physician Assistants and Nurse Practitioners) who all work together to provide you with the care you need, when you need it.  We recommend signing up for the patient portal called "MyChart".  Sign up information is provided on this After Visit Summary.  MyChart is used to connect with patients for Virtual Visits (Telemedicine).  Patients are able to view lab/test results, encounter notes, upcoming appointments, etc.  Non-urgent messages  can be sent to your provider as well.   To learn more about what you can do with MyChart, go to [email or web address].    Your next appointment:   6 month(s)  The format for your next appointment:   In Person  Provider:   Nanetta Batty, MD   Other Instructions   Your cardiac CT will be scheduled at the below location:   [facility] [address] [place], [place] [address] [phone]   If scheduled at [facility], please arrive at the [facility] main entrance (entrance A) of [facility] 30 minutes prior to test start time. You can use the FREE valet parking offered at the main entrance (encouraged to control the heart rate for the test) Proceed to the [facility] Radiology Department (first floor) to check-in and test prep.   Please follow these instructions carefully (unless otherwise directed):   On the Night Before the Test: Be sure to Drink plenty of water. Do not consume any caffeinated/decaffeinated beverages or chocolate 12 hours prior to your test. Do not take any antihistamines 12 hours prior to your test.  On the Day of the Test: Drink plenty of water until 1 hour prior to the test. Do not eat any food 4 hours prior to the test. You may take your regular medications prior to the test.  Take metoprolol (Lopressor)100mg  two hours prior to test. HOLD Furosemide/Hydrochlorothiazide morning of the test. FEMALES- please wear underwire-free bra if available, avoid dresses & tight clothing       After the Test: Drink plenty of water. After  receiving IV contrast, you may experience a mild flushed feeling. This is normal. On occasion, you may experience a mild rash up to 24 hours after the test. This is not dangerous. If this occurs, you can take Benadryl 25 mg and increase your fluid intake. If you experience trouble breathing, this can be serious. If it is severe call 911 IMMEDIATELY. If it is mild, please call our office. If you  take any of these medications: Glipizide/Metformin, Avandament, Glucavance, please do not take 48 hours after completing test unless otherwise instructed.  We will call to schedule your test 2-4 weeks out understanding that some insurance companies will need an authorization prior to the service being performed.   For non-scheduling related questions, please contact the cardiac imaging nurse navigator should you have any questions/concerns: Rockwell Alexandria, Cardiac Imaging Nurse Navigator [name], Cardiac Imaging Nurse Navigator [facility] Direct Office Dial: [phone]   For scheduling needs, including cancellations and rescheduling, please call [place], [phone].

## 2021-03-19 NOTE — Assessment & Plan Note (Signed)
Several month history of atypical chest pain now occurring on a daily basis.  She says the pain can last all day, radiates to her right chest and right arm.  I am going to get a coronary CTA to further evaluate

## 2021-03-19 NOTE — Assessment & Plan Note (Signed)
History of essential hypertension with blood pressure measured today at 156/96.  She is on Benicar.

## 2021-03-19 NOTE — Progress Notes (Signed)
03/19/2021 [facility]   [birth year]  [id number]  Primary Physician Minette Brine, [place] Primary Cardiologist: Lorretta Harp MD Lupe Carney, [place]  HPI:  Makayla [facility]ilar is a 56 y.o. 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  She was referred by her primary care provider, Minette Brine, FNP for evaluation of chest pain and shortness of breath.  Her cardiovascular risk factor profile is notable for ongoing tobacco abuse of 1/2 pack/day of 40-pack-year history, treated hypertension.  There is no family history of heart disease.  She is never had a heart attack or stroke.  She does have obstructive sleep apnea on CPAP.  She is noticed increasing dyspnea on exertion as well as shortness of breath at rest with atypical chest pain that occurs on a daily basis.  The pain can last all day.  Is mostly on her right side with occasional radiation to her right upper extremity.   Current Meds  Medication Sig   ibuprofen (ADVIL) 800 MG tablet Take 1 tablet (800 mg total) by mouth every 8 (eight) hours as needed.   Multiple Vitamin (MULTIVITAMIN PO) Take by mouth daily.   olmesartan (BENICAR) 40 MG tablet Take 1 tablet (40 mg total) by mouth daily.     Allergies  Allergen Reactions   Hydrocodone     Gets really high    Social History   Socioeconomic History   Marital status: Single    Spouse name: Not on file   Number of children: Not on file   Years of education: Not on file   Highest education level: Not on file  Occupational History   Not on file  Tobacco Use   Smoking status: Every Day    Packs/day: 0.50    Types: Cigarettes    Last attempt to quit: 02/01/2019    Years since quitting: 2.1   Smokeless tobacco: Never   Tobacco comments:    She has started back smoking about 2 cigarettes a day  Substance and Sexual Activity   Alcohol  use: Not Currently   Drug use: Not Currently   Sexual activity: Not on file  Other Topics Concern   Not on file  Social History Narrative   Not on file   Social Determinants of Health   Financial Resource Strain: Not on file  Food Insecurity: Not on file  Transportation Needs: Not on file  Physical Activity: Not on file  Stress: Not on file  Social Connections: Not on file  Intimate Partner Violence: Not on file     Review of Systems: General: negative for chills, fever, night sweats or weight changes.  Cardiovascular: negative for chest pain, dyspnea on exertion, edema, orthopnea, palpitations, paroxysmal nocturnal dyspnea or shortness of breath Dermatological: negative for rash Respiratory: negative for cough or wheezing Urologic: negative for hematuria Abdominal: negative for nausea, vomiting, diarrhea, bright red blood per rectum, melena, or hematemesis Neurologic: negative for visual changes, syncope, or dizziness All other systems reviewed and are otherwise negative except as noted above.    Blood pressure (!) 156/96, pulse 82, height 5\' 5"  (075-GRM m), weight 216 lb 3.2 oz (98.1 kg), SpO2 96 %.  General appearance: alert and no distress Neck: no adenopathy, no carotid bruit, no JVD, supple, symmetrical, trachea midline, and thyroid not enlarged, symmetric, no tenderness/mass/nodules Lungs: clear to auscultation bilaterally Heart: regular rate  and rhythm, S1, S2 normal, no murmur, click, rub or gallop Extremities: extremities normal, atraumatic, no cyanosis or edema Pulses: 2+ and symmetric Skin: Skin color, texture, turgor normal. No rashes or lesions Neurologic: Grossly normal  EKG sinus rhythm 82 without ST or T wave changes.  I personally reviewed this EKG.  ASSESSMENT AND PLAN:   Obstructive sleep apnea History of obstructive sleep apnea on CPAP  Dyspnea on exertion History of progressive dyspnea on exertion over the last several months.  She does have a  40-pack-year history tobacco abuse currently smoking 1/2 pack/day with an interest to stop.  I am going to get a 2D echo as well to further evaluate  Atypical chest pain Several month history of atypical chest pain now occurring on a daily basis.  She says the pain can last all day, radiates to her right chest and right arm.  I am going to get a coronary CTA to further evaluate  Essential hypertension History of essential hypertension with blood pressure measured today at 156/96.  She is on Benicar.     Lorretta Harp MD FACP,FACC,FAHA, [facility] 03/19/2021 8:37 AM

## 2021-03-23 ENCOUNTER — Other Ambulatory Visit: Payer: Self-pay

## 2021-03-23 ENCOUNTER — Telehealth: Payer: Self-pay

## 2021-03-23 ENCOUNTER — Ambulatory Visit: Payer: Medicaid Other

## 2021-03-23 VITALS — BP 124/80 | HR 78 | Temp 98.1°F | Ht 65.0 in | Wt 213.2 lb

## 2021-03-23 DIAGNOSIS — I10 Essential (primary) hypertension: Secondary | ICD-10-CM

## 2021-03-23 NOTE — Progress Notes (Signed)
The patient's blood pressure is stable, she was told to continue with her current medications.

## 2021-03-23 NOTE — Patient Instructions (Signed)
Cooking With Less Salt Cooking with less salt is one way to reduce the amount of sodium you get from food. Sodium is one of the elements that make up salt. It is found naturally in foods and is also added to certain foods. Depending on your condition and overall health, your health care provider or dietitian may recommend that you reduce your sodium intake. Most people should have less than 2,300 milligrams (mg) of sodium each day. If you have high blood pressure (hypertension), you may need to limit your sodium to 1,500 mg each day. Follow the tipsbelow to help reduce your sodium intake. What are tips for eating less sodium? Reading food labels  Check the food label before buying or using packaged ingredients. Always check the label for the serving size and sodium content. Look for products with no more than 140 mg of sodium in one serving. Check the % Daily Value column to see what percent of the daily recommended amount of sodium is provided in one serving of the product. Foods with 5% or less in this column are considered low in sodium. Foods with 20% or higher are considered high in sodium. Do not choose foods with salt as one of the first three ingredients on the ingredients list. If salt is one of the first three ingredients, it usually means the item is high in sodium.  Shopping Buy sodium-free or low-sodium products. Look for the following words on food labels: Low-sodium. Sodium-free. Reduced-sodium. No salt added. Unsalted. Always check the sodium content even if foods are labeled as low-sodium or no salt added. Buy fresh foods. Cooking Use herbs, seasonings without salt, and spices as substitutes for salt. Use sodium-free baking soda when baking. Grill, braise, or roast foods to add flavor with less salt. Avoid adding salt to pasta, rice, or hot cereals. Drain and rinse canned vegetables, beans, and meat before use. Avoid adding salt when cooking sweets and desserts. Cook with  low-sodium ingredients. What foods are high in sodium? Vegetables Regular canned vegetables (not low-sodium or reduced-sodium). Sauerkraut, pickled vegetables, and relishes. Olives. French fries. Onion rings. Regular canned tomato sauce and paste. Regular tomato and vegetable juice. Frozenvegetables in sauces. Grains Instant hot cereals. Bread stuffing, pancake, and biscuit mixes. Croutons. Seasoned rice or pasta mixes. Noodle soup cups. Boxed or frozen macaroni and cheese. Regular salted crackers. Self-rising flour. Rolls. Bagels. Flourtortillas and wraps. Meats and other proteins Meat or fish that is salted, canned, smoked, cured, spiced, or pickled. This includes bacon, ham, sausages, hot dogs, corned beef, chipped beef, meat loaves, salt pork, jerky, pickled herring, anchovies, regular canned tuna, andsardines. Salted nuts. Dairy Processed cheese and cheese spreads. Cheese curds. Blue cheese. Feta cheese.String cheese. Regular cottage cheese. Buttermilk. Canned milk. The items listed above may not be a complete list of foods high in sodium. Actual amounts of sodium may be different depending on processing. Contact a dietitian for more information. What foods are low in sodium? Fruits Fresh, frozen, or canned fruit with no sauce added. Fruit juice. Vegetables Fresh or frozen vegetables with no sauce added. "No salt added" canned vegetables. "No salt added" tomato sauce and paste. Low-sodium orreduced-sodium tomato and vegetable juice. Grains Noodles, pasta, quinoa, rice. Shredded or puffed wheat or puffed rice. Regular or quick oats (not instant). Low-sodium crackers. Low-sodium bread. Whole-grainbread and whole-grain pasta. Unsalted popcorn. Meats and other proteins Fresh or frozen whole meats, poultry (not injected with sodium), and fish with no sauce added. Unsalted nuts. Dried peas, beans, and   lentils without added salt. Unsalted canned beans. Eggs. Unsalted nut butters. Low-sodium canned  tunaor chicken. Dairy Milk. Soy milk. Yogurt. Low-sodium cheeses, such as Swiss, Monterey Jack, mozzarella, and ricotta. Sherbet or ice cream (keep to  cup per serving).Cream cheese. Fats and oils Unsalted butter or margarine. Other foods Homemade pudding. Sodium-free baking soda and baking powder. Herbs and spices.Low-sodium seasoning mixes. Beverages Coffee and tea. Carbonated beverages. The items listed above may not be a complete list of foods low in sodium. Actual amounts of sodium may be different depending on processing. Contact a dietitian for more information. What are some salt alternatives when cooking? The following are herbs, seasonings, and spices that can be used instead of salt to flavor your food. Herbs should be fresh or dried. Do not choose packaged mixes. Next to the name of the herb, spice, or seasoning aresome examples of foods you can pair it with. Herbs Bay leaves - Soups, meat and vegetable dishes, and spaghetti sauce. Basil - Italian dishes, soups, pasta, and fish dishes. Cilantro - Meat, poultry, and vegetable dishes. Chili powder - Marinades and Mexican dishes. Chives - Salad dressings and potato dishes. Cumin - Mexican dishes, couscous, and meat dishes. Dill - Fish dishes, sauces, and salads. Fennel - Meat and vegetable dishes, breads, and cookies. Garlic (do not use garlic salt) - Italian dishes, meat dishes, salad dressings, and sauces. Marjoram - Soups, potato dishes, and meat dishes. Oregano - Pizza and spaghetti sauce. Parsley - Salads, soups, pasta, and meat dishes. Rosemary - Italian dishes, salad dressings, soups, and red meats. Saffron - Fish dishes, pasta, and some poultry dishes. Sage - Stuffings and sauces. Tarragon - Fish and poultry dishes. Thyme - Stuffing, meat, and fish dishes. Seasonings Lemon juice - Fish dishes, poultry dishes, vegetables, and salads. Vinegar - Salad dressings, vegetables, and fish dishes. Spices Cinnamon - Sweet  dishes, such as cakes, cookies, and puddings. Cloves - Gingerbread, puddings, and marinades for meats. Curry - Vegetable dishes, fish and poultry dishes, and stir-fry dishes. Ginger - Vegetable dishes, fish dishes, and stir-fry dishes. Nutmeg - Pasta, vegetables, poultry, fish dishes, and custard. Summary Cooking with less salt is one way to reduce the amount of sodium that you get from food. Buy sodium-free or low-sodium products. Check the food label before using or buying packaged ingredients. Use herbs, seasonings without salt, and spices as substitutes for salt in foods. This information is not intended to replace advice given to you by your health care provider. Make sure you discuss any questions you have with your healthcare provider. Document Revised: 01/09/2019 Document Reviewed: 01/09/2019 Elsevier Patient Education  2022 Elsevier Inc.  

## 2021-03-23 NOTE — Telephone Encounter (Signed)
The pt wanted to know if she could get a prescription for ibuprofen 800mg  because she tried a couple from a friend and it helped with her pain.  The pt states that she only takes the med from time to time. , DNP, FNP-BC sent a prescription to the pharmacy and the pt was notified.

## 2021-03-24 ENCOUNTER — Encounter: Payer: Self-pay | Admitting: Nurse Practitioner

## 2021-03-24 ENCOUNTER — Other Ambulatory Visit: Payer: Self-pay | Admitting: Nurse Practitioner

## 2021-03-24 MED ORDER — IBUPROFEN 800 MG PO TABS: 800.0000 mg | ORAL_TABLET | Freq: Three times a day (TID) | ORAL | 0 refills | Status: DC | PRN

## 2021-03-25 ENCOUNTER — Other Ambulatory Visit: Payer: Self-pay

## 2021-03-25 MED ORDER — IBUPROFEN 800 MG PO TABS: 800.0000 mg | ORAL_TABLET | Freq: Three times a day (TID) | ORAL | 0 refills | Status: DC | PRN

## 2021-03-29 ENCOUNTER — Other Ambulatory Visit (HOSPITAL_COMMUNITY): Payer: Medicaid Other

## 2021-03-29 ENCOUNTER — Telehealth (HOSPITAL_COMMUNITY): Payer: Self-pay | Admitting: Cardiovascular Disease

## 2021-03-29 NOTE — Telephone Encounter (Signed)
Just an FYI.  We will be removing the patient from the echo [place] for the reason below:  03/29/21 spoke with patient and she does not want to reschedule echo/[facility] 11:01 03/22/21 pt lvm to cancel @ 12:12/[facility] I returned call and LVM for pt to call office   Thank you

## 2021-04-19 ENCOUNTER — Ambulatory Visit: Payer: Medicaid Other

## 2021-04-26 ENCOUNTER — Other Ambulatory Visit: Payer: Self-pay

## 2021-04-26 ENCOUNTER — Other Ambulatory Visit: Payer: Self-pay | Admitting: Nurse Practitioner

## 2021-04-26 ENCOUNTER — Ambulatory Visit
Admission: RE | Admit: 2021-04-26 | Discharge: 2021-04-26 | Disposition: A | Discharge status: Home / Self Care | Payer: Medicaid Other | Source: Ambulatory Visit | Attending: Nurse Practitioner | Admitting: Nurse Practitioner

## 2021-04-26 DIAGNOSIS — R0602 Shortness of breath: Secondary | ICD-10-CM | POA: Diagnosis not present

## 2021-04-26 DIAGNOSIS — R079 Chest pain, unspecified: Secondary | ICD-10-CM | POA: Diagnosis not present

## 2021-04-26 DIAGNOSIS — R1310 Dysphagia, unspecified: Secondary | ICD-10-CM | POA: Diagnosis not present

## 2021-04-26 DIAGNOSIS — Z1231 Encounter for screening mammogram for malignant neoplasm of breast: Secondary | ICD-10-CM

## 2021-04-26 DIAGNOSIS — K219 Gastro-esophageal reflux disease without esophagitis: Secondary | ICD-10-CM | POA: Diagnosis not present

## 2021-04-27 ENCOUNTER — Other Ambulatory Visit: Payer: Self-pay | Admitting: Gastroenterology

## 2021-04-27 DIAGNOSIS — R1011 Right upper quadrant pain: Secondary | ICD-10-CM

## 2021-05-03 ENCOUNTER — Ambulatory Visit (HOSPITAL_COMMUNITY): Payer: Medicaid Other | Attending: Cardiovascular Disease

## 2021-05-03 DIAGNOSIS — R0609 Other forms of dyspnea: Secondary | ICD-10-CM | POA: Diagnosis not present

## 2021-05-03 DIAGNOSIS — I10 Essential (primary) hypertension: Secondary | ICD-10-CM | POA: Insufficient documentation

## 2021-05-03 DIAGNOSIS — G4733 Obstructive sleep apnea (adult) (pediatric): Secondary | ICD-10-CM | POA: Insufficient documentation

## 2021-05-03 DIAGNOSIS — R072 Precordial pain: Secondary | ICD-10-CM | POA: Diagnosis not present

## 2021-05-03 DIAGNOSIS — R0789 Other chest pain: Secondary | ICD-10-CM

## 2021-05-03 LAB — LIPID PANEL
Chol/HDL Ratio: 3.5 ratio (ref 0.0–4.4)
Cholesterol, Total: 188 mg/dL (ref 100–199)
HDL: 53 mg/dL (ref >39)
LDL Chol Calc (NIH): 118 mg/dL — ABNORMAL HIGH (ref 0–99)
Triglycerides: 91 mg/dL (ref 0–149)
VLDL Cholesterol Cal: 17 mg/dL (ref 5–40)

## 2021-05-03 LAB — BASIC METABOLIC PANEL
BUN/Creatinine Ratio: 17 (ref 9–23)
BUN: 18 mg/dL (ref 6–24)
CO2: 26 mmol/L (ref 20–29)
Calcium: 9.5 mg/dL (ref 8.7–10.2)
Chloride: 106 mmol/L (ref 96–106)
Creatinine, Ser: 1.08 mg/dL — ABNORMAL HIGH (ref 0.57–1.00)
Glucose: 81 mg/dL (ref 70–99)
Potassium: 4.3 mmol/L (ref 3.5–5.2)
Sodium: 142 mmol/L (ref 134–144)
eGFR: 60 mL/min/{1.73_m2} (ref >59)

## 2021-05-03 LAB — HEPATIC FUNCTION PANEL
ALT: 12 IU/L (ref 0–32)
AST: 10 IU/L (ref 0–40)
Albumin: 4.3 g/dL (ref 3.8–4.9)
Alkaline Phosphatase: 77 IU/L (ref 44–121)
Bilirubin Total: 0.3 mg/dL (ref 0.0–1.2)
Bilirubin, Direct: <0.1 mg/dL (ref 0.00–0.40)
Total Protein: 6.8 g/dL (ref 6.0–8.5)

## 2021-05-03 LAB — ECHOCARDIOGRAM COMPLETE
Area-P 1/2: 4.46 cm2
S' Lateral: 2.6 cm

## 2021-05-04 ENCOUNTER — Other Ambulatory Visit (HOSPITAL_COMMUNITY): Payer: Self-pay | Admitting: *Deleted

## 2021-05-04 MED ORDER — METOPROLOL TARTRATE 100 MG PO TABS: 100.0000 mg | ORAL_TABLET | Freq: Once | ORAL | 0 refills | Status: DC

## 2021-05-05 ENCOUNTER — Other Ambulatory Visit (HOSPITAL_COMMUNITY): Payer: Self-pay | Admitting: Cardiovascular Disease

## 2021-05-06 ENCOUNTER — Encounter: Payer: Self-pay | Admitting: Cardiovascular Disease

## 2021-05-06 ENCOUNTER — Telehealth (HOSPITAL_COMMUNITY): Payer: Self-pay | Admitting: Emergency Medicine

## 2021-05-06 DIAGNOSIS — R0789 Other chest pain: Secondary | ICD-10-CM

## 2021-05-06 NOTE — Telephone Encounter (Signed)
Reaching out to patient to offer assistance regarding upcoming cardiac imaging study; pt verbalizes understanding of appt date/time, parking situation and where to check in, pre-test NPO status and medications ordered, and verified current allergies; name and call back number provided for further questions should they arise ?Marchia Bond RN Navigator Cardiac Imaging ?[facility] ?[phone] office ?[phone] cell ? ?Pt states shes having issues getting one time dose metoprolol tartrate for HR control from pharmacy on file. States the "MD didn't approve it" - im calling the pharm to clarify whats needed for this patient to get the medication for tomorrows test.  ? ?Pharmacy reports medication is now ready  for pick up- pt aware  ? ?Denies iv issues ?Arrival 400 ?

## 2021-05-07 ENCOUNTER — Encounter (HOSPITAL_COMMUNITY): Payer: Self-pay

## 2021-05-07 ENCOUNTER — Ambulatory Visit (HOSPITAL_COMMUNITY)
Admission: RE | Admit: 2021-05-07 | Discharge: 2021-05-07 | Disposition: A | Discharge status: Home / Self Care | Payer: Medicaid Other | Source: Ambulatory Visit | Attending: Cardiovascular Disease | Admitting: Cardiovascular Disease

## 2021-05-07 DIAGNOSIS — R072 Precordial pain: Secondary | ICD-10-CM | POA: Diagnosis not present

## 2021-05-07 MED ORDER — NITROGLYCERIN 0.4 MG SL SUBL
SUBLINGUAL_TABLET | SUBLINGUAL | Status: AC
  Filled 2021-05-07: qty 2

## 2021-05-07 MED ORDER — IOHEXOL 350 MG/ML SOLN
100.0000 mL | Freq: Once | INTRAVENOUS | Status: AC | PRN
  Administered 2021-05-07: 100 mL via INTRAVENOUS

## 2021-05-07 MED ORDER — NITROGLYCERIN 0.4 MG SL SUBL
0.8000 mg | SUBLINGUAL_TABLET | Freq: Once | SUBLINGUAL | Status: AC
  Administered 2021-05-07: 0.8 mg via SUBLINGUAL

## 2021-05-13 ENCOUNTER — Ambulatory Visit
Admission: RE | Admit: 2021-05-13 | Discharge: 2021-05-13 | Disposition: A | Discharge status: Home / Self Care | Payer: Medicaid Other | Source: Ambulatory Visit | Attending: Gastroenterology | Admitting: Gastroenterology

## 2021-05-13 DIAGNOSIS — R1011 Right upper quadrant pain: Secondary | ICD-10-CM | POA: Diagnosis not present

## 2021-05-18 ENCOUNTER — Ambulatory Visit: Payer: Medicaid Other

## 2021-05-18 ENCOUNTER — Encounter: Payer: Self-pay | Admitting: Nurse Practitioner

## 2021-05-18 VITALS — BP 132/86 | HR 74 | Temp 98.2°F | Ht 65.0 in | Wt 214.4 lb

## 2021-05-18 DIAGNOSIS — I10 Essential (primary) hypertension: Secondary | ICD-10-CM

## 2021-05-18 NOTE — Progress Notes (Signed)
Patient presents today for a bp check. ?

## 2021-05-25 ENCOUNTER — Other Ambulatory Visit: Payer: Self-pay | Admitting: Nurse Practitioner

## 2021-05-25 DIAGNOSIS — I10 Essential (primary) hypertension: Secondary | ICD-10-CM

## 2021-06-11 ENCOUNTER — Encounter: Payer: Self-pay | Admitting: Nurse Practitioner

## 2021-06-12 ENCOUNTER — Other Ambulatory Visit: Payer: Self-pay

## 2021-06-12 DIAGNOSIS — I10 Essential (primary) hypertension: Secondary | ICD-10-CM

## 2021-06-12 MED ORDER — OLMESARTAN MEDOXOMIL 40 MG PO TABS: 40.0000 mg | ORAL_TABLET | Freq: Every day | ORAL | 2 refills | Status: DC

## 2021-07-07 ENCOUNTER — Ambulatory Visit: Payer: Medicaid Other | Admitting: Nurse Practitioner

## 2021-09-21 ENCOUNTER — Encounter: Payer: Self-pay | Admitting: Nurse Practitioner

## 2021-09-21 ENCOUNTER — Other Ambulatory Visit: Payer: Self-pay

## 2021-09-21 DIAGNOSIS — I10 Essential (primary) hypertension: Secondary | ICD-10-CM

## 2021-09-21 MED ORDER — OLMESARTAN MEDOXOMIL 40 MG PO TABS: 40.0000 mg | ORAL_TABLET | Freq: Every day | ORAL | 0 refills | Status: DC

## 2021-09-22 DIAGNOSIS — Z86010 Personal history of colonic polyps: Secondary | ICD-10-CM | POA: Diagnosis not present

## 2021-09-22 DIAGNOSIS — R1310 Dysphagia, unspecified: Secondary | ICD-10-CM | POA: Diagnosis not present

## 2021-10-14 ENCOUNTER — Encounter: Payer: Self-pay | Admitting: Nurse Practitioner

## 2021-10-14 ENCOUNTER — Ambulatory Visit: Payer: Medicaid Other | Admitting: Nurse Practitioner

## 2021-10-14 VITALS — BP 136/84 | HR 79 | Temp 98.1°F | Ht 65.0 in | Wt 213.0 lb

## 2021-10-14 DIAGNOSIS — Z2821 Immunization not carried out because of patient refusal: Secondary | ICD-10-CM | POA: Diagnosis not present

## 2021-10-14 DIAGNOSIS — E669 Obesity, unspecified: Secondary | ICD-10-CM

## 2021-10-14 DIAGNOSIS — G44029 Chronic cluster headache, not intractable: Secondary | ICD-10-CM | POA: Diagnosis not present

## 2021-10-14 DIAGNOSIS — Z6835 Body mass index (BMI) 35.0-35.9, adult: Secondary | ICD-10-CM

## 2021-10-14 DIAGNOSIS — E7800 Pure hypercholesterolemia, unspecified: Secondary | ICD-10-CM | POA: Diagnosis not present

## 2021-10-14 DIAGNOSIS — I10 Essential (primary) hypertension: Secondary | ICD-10-CM

## 2021-10-14 DIAGNOSIS — E6609 Other obesity due to excess calories: Secondary | ICD-10-CM | POA: Diagnosis not present

## 2021-10-14 MED ORDER — OLMESARTAN MEDOXOMIL 40 MG PO TABS: 40.0000 mg | ORAL_TABLET | Freq: Every day | ORAL | 1 refills | Status: DC

## 2021-10-14 MED ORDER — UBRELVY 50 MG PO TABS: 1.0000 | ORAL_TABLET | Freq: Every day | ORAL | 2 refills | Status: AC

## 2021-10-14 NOTE — Progress Notes (Signed)
I,Tianna Badgett,acting as a Education administrator for [employer], FNP.,have documented all relevant documentation on the behalf of Minette Brine, FNP,as directed by  Minette Brine, FNP while in the presence of Minette Brine, [place].  Subjective:     Patient ID: Makayla Aguilar , female    DOB: April 24, 1965 , 56 y.o.   MRN: [id number]   Chief Complaint  Patient presents with   Hypertension    HPI  Patient presents today for a HTN follow up.   Hypertension This is a new problem. The current episode started more than 1 year ago. The problem has been gradually improving since onset. The problem is uncontrolled. Associated symptoms include headaches (2-3 years she had been taking ibuprofen, she has also taken excedrin migraine without effect. Frontal area can be for 2-3 days, will have to take days off from work). Pertinent negatives include no anxiety, chest pain, malaise/fatigue or palpitations. Risk factors for coronary artery disease include sedentary lifestyle and obesity. There is no history of chronic renal disease.     Past Medical History:  Diagnosis Date   Hypertension    OSA on CPAP    Sleep apnea      Family History  Problem Relation Age of Onset   Alcohol abuse Mother    Diabetes Father    Breast cancer Neg Hx      Current Outpatient Medications:    Multiple Vitamin (MULTIVITAMIN PO), Take by mouth daily., Disp: , Rfl:    Ubrogepant (UBRELVY) 50 MG TABS, Take 1 tablet by mouth daily., Disp: 30 tablet, Rfl: 2   olmesartan (BENICAR) 40 MG tablet, Take 1 tablet (40 mg total) by mouth daily., Disp: 90 tablet, Rfl: 1   Allergies  Allergen Reactions   Hydrocodone     Gets really high     Review of Systems  Constitutional: Negative.  Negative for malaise/fatigue.  Eyes:  Positive for photophobia.  Respiratory: Negative.    Cardiovascular: Negative.  Negative for chest pain, palpitations and leg swelling.  Gastrointestinal:  Negative for nausea.  Neurological:  Positive for headaches  (2-3 years she had been taking ibuprofen, she has also taken excedrin migraine without effect. Frontal area can be for 2-3 days, will have to take days off from work). Negative for dizziness and light-headedness.  Psychiatric/Behavioral: Negative.       Today's Vitals   10/14/21 1454  BP: 136/84  Pulse: 79  Temp: 98.1 F (36.7 C)  TempSrc: Oral  Weight: 213 lb (96.6 kg)  Height: 5' 5"  (1.651 m)   Body mass index is 35.45 kg/m.  Wt Readings from Last 3 Encounters:  10/14/21 213 lb (96.6 kg)  05/18/21 214 lb 6.4 oz (97.3 kg)  03/23/21 213 lb 3.2 oz (96.7 kg)    Objective:  Physical Exam Vitals reviewed.  Constitutional:      General: She is not in acute distress.    Appearance: Normal appearance. She is well-developed. She is obese.  HENT:     Head: Normocephalic and atraumatic.  Eyes:     Pupils: Pupils are equal, round, and reactive to light.  Cardiovascular:     Rate and Rhythm: Normal rate and regular rhythm.     Pulses: Normal pulses.     Heart sounds: Normal heart sounds. No murmur heard. Pulmonary:     Effort: Pulmonary effort is normal. No respiratory distress.     Breath sounds: Normal breath sounds. No wheezing.  Skin:    General: Skin is warm and dry.  Capillary Refill: Capillary refill takes less than 2 seconds.  Neurological:     General: No focal deficit present.     Mental Status: She is alert and oriented to person, place, and time.     Cranial Nerves: No cranial nerve deficit.     Motor: No weakness.  Psychiatric:        Mood and Affect: Mood normal.        Behavior: Behavior normal.        Thought Content: Thought content normal.        Judgment: Judgment normal.         Assessment And Plan:     1. Primary hypertension Comments: Blood pressure is fairly controlled, continue f/u with Cardiology - BMP8+EGFR - olmesartan (BENICAR) 40 MG tablet; Take 1 tablet (40 mg total) by mouth daily.  Dispense: 90 tablet; Refill: 1  2. Elevated  cholesterol Comments: Continue diet control, avoid fried and fatty foods.   3. Chronic cluster headache, not intractable Comments: Will try her on ubrelvy samples given.  - Ubrogepant (UBRELVY) 50 MG TABS; Take 1 tablet by mouth daily.  Dispense: 30 tablet; Refill: 2  4. Obesity (BMI 35.0-39.9 without comorbidity) Chronic Discussed healthy diet and regular exercise options  Encouraged to exercise at least 150 minutes per week with 2 days of strength training She is encouraged to strive for BMI less than 30 to decrease cardiac risk.   5. Herpes zoster vaccination declined Declines shingrix, educated on disease process and is aware if he changes his mind to notify office  6. Influenza vaccination declined Patient declined influenza vaccination at this time. Patient is aware that influenza vaccine prevents illness in 70% of healthy people, and reduces hospitalizations to 30-70% in elderly. This vaccine is recommended annually. Education has been provided regarding the importance of this vaccine but patient still declined. Advised may receive this vaccine at local pharmacy or Health Dept.or vaccine clinic. Aware to provide a copy of the vaccination record if obtained from local pharmacy or Health Dept.  Pt is willing to accept risk associated with refusing vaccination.  7. COVID-19 vaccine dose declined Declines covid 19 vaccine. Discussed risk of covid 3 and if she changes her mind about the vaccine to call the office. Education has been provided regarding the importance of this vaccine but patient still declined. Advised may receive this vaccine at local pharmacy or Health Dept.or vaccine clinic. Aware to provide a copy of the vaccination record if obtained from local pharmacy or Health Dept.  Encouraged to take multivitamin, vitamin d, vitamin c and zinc to increase immune system. Aware can call office if would like to have vaccine here at office. Verbalized acceptance and understanding.      Patient was given opportunity to ask questions. Patient verbalized understanding of the plan and was able to repeat key elements of the plan. All questions were answered to their satisfaction.  Minette Brine, FNP   I, Minette Brine, FNP, have reviewed all documentation for this visit. The documentation on 10/14/21 for the exam, diagnosis, procedures, and orders are all accurate and complete.   IF YOU HAVE BEEN REFERRED TO A SPECIALIST, IT MAY TAKE 1-2 WEEKS TO SCHEDULE/PROCESS THE REFERRAL. IF YOU HAVE NOT HEARD FROM US/SPECIALIST IN TWO WEEKS, PLEASE GIVE [place] A CALL AT [phone].   THE PATIENT IS ENCOURAGED TO PRACTICE SOCIAL DISTANCING DUE TO THE COVID-19 PANDEMIC.

## 2021-10-14 NOTE — Patient Instructions (Signed)
Hypertension, Adult High blood pressure (hypertension) is when the force of blood pumping through the arteries is too strong. The arteries are the blood vessels that carry blood from the heart throughout the body. Hypertension forces the heart to work harder to pump blood and may cause arteries to become narrow or stiff. Untreated or uncontrolled hypertension can lead to a heart attack, heart failure, a stroke, kidney disease, and other problems. A blood pressure reading consists of a higher number over a lower number. Ideally, your blood pressure should be below 120/80. The first ("top") number is called the systolic pressure. It is a measure of the pressure in your arteries as your heart beats. The second ("bottom") number is called the diastolic pressure. It is a measure of the pressure in your arteries as the heart relaxes. What are the causes? The exact cause of this condition is not known. There are some conditions that result in high blood pressure. What increases the risk? Certain factors may make you more likely to develop high blood pressure. Some of these risk factors are under your control, including: Smoking. Not getting enough exercise or physical activity. Being overweight. Having too much fat, sugar, calories, or salt (sodium) in your diet. Drinking too much alcohol. Other risk factors include: Having a personal history of heart disease, diabetes, high cholesterol, or kidney disease. Stress. Having a family history of high blood pressure and high cholesterol. Having obstructive sleep apnea. Age. The risk increases with age. What are the signs or symptoms? High blood pressure may not cause symptoms. Very high blood pressure (hypertensive crisis) may cause: Headache. Fast or irregular heartbeats (palpitations). Shortness of breath. Nosebleed. Nausea and vomiting. Vision changes. Severe chest pain, dizziness, and seizures. How is this diagnosed? This condition is diagnosed by  measuring your blood pressure while you are seated, with your arm resting on a flat surface, your legs uncrossed, and your feet flat on the floor. The cuff of the blood pressure monitor will be placed directly against the skin of your upper arm at the level of your heart. Blood pressure should be measured at least twice using the same arm. Certain conditions can cause a difference in blood pressure between your right and left arms. If you have a high blood pressure reading during one visit or you have normal blood pressure with other risk factors, you may be asked to: Return on a different day to have your blood pressure checked again. Monitor your blood pressure at home for 1 week or longer. If you are diagnosed with hypertension, you may have other blood or imaging tests to help your health care provider understand your overall risk for other conditions. How is this treated? This condition is treated by making healthy lifestyle changes, such as eating healthy foods, exercising more, and reducing your alcohol intake. You may be referred for counseling on a healthy diet and physical activity. Your health care provider may prescribe medicine if lifestyle changes are not enough to get your blood pressure under control and if: Your systolic blood pressure is above 130. Your diastolic blood pressure is above 80. Your personal target blood pressure may vary depending on your medical conditions, your age, and other factors. Follow these instructions at home: Eating and drinking  Eat a diet that is high in fiber and potassium, and low in sodium, added sugar, and fat. An example of this eating plan is called the DASH diet. DASH stands for Dietary Approaches to Stop Hypertension. To eat this way: Eat   plenty of fresh fruits and vegetables. Try to fill one half of your plate at each meal with fruits and vegetables. Eat whole grains, such as whole-wheat pasta, brown rice, or whole-grain bread. Fill about one  fourth of your plate with whole grains. Eat or drink low-fat dairy products, such as skim milk or low-fat yogurt. Avoid fatty cuts of meat, processed or cured meats, and poultry with skin. Fill about one fourth of your plate with lean proteins, such as fish, chicken without skin, beans, eggs, or tofu. Avoid pre-made and processed foods. These tend to be higher in sodium, added sugar, and fat. Reduce your daily sodium intake. Many people with hypertension should eat less than 1,500 mg of sodium a day. Do not drink alcohol if: Your health care provider tells you not to drink. You are pregnant, may be pregnant, or are planning to become pregnant. If you drink alcohol: Limit how much you have to: 0-1 drink a day for women. 0-2 drinks a day for men. Know how much alcohol is in your drink. In the [place] one drink equals one 12 oz bottle of beer (355 mL), one 5 oz glass of wine (148 mL), or one 1 oz glass of hard liquor (44 mL). Lifestyle  Work with your health care provider to maintain a healthy body weight or to lose weight. Ask what an ideal weight is for you. Get at least 30 minutes of exercise that causes your heart to beat faster (aerobic exercise) most days of the week. Activities may include walking, swimming, or biking. Include exercise to strengthen your muscles (resistance exercise), such as Pilates or lifting weights, as part of your weekly exercise routine. Try to do these types of exercises for 30 minutes at least 3 days a week. Do not use any products that contain nicotine or tobacco. These products include cigarettes, chewing tobacco, and vaping devices, such as e-cigarettes. If you need help quitting, ask your health care provider. Monitor your blood pressure at home as told by your health care provider. Keep all follow-up visits. This is important. Medicines Take over-the-counter and prescription medicines only as told by your health care provider. Follow directions carefully. Blood  pressure medicines must be taken as prescribed. Do not skip doses of blood pressure medicine. Doing this puts you at risk for problems and can make the medicine less effective. Ask your health care provider about side effects or reactions to medicines that you should watch for. Contact a health care provider if you: Think you are having a reaction to a medicine you are taking. Have headaches that keep coming back (recurring). Feel dizzy. Have swelling in your ankles. Have trouble with your vision. Get help right away if you: Develop a severe headache or confusion. Have unusual weakness or numbness. Feel faint. Have severe pain in your chest or abdomen. Vomit repeatedly. Have trouble breathing. These symptoms may be an emergency. Get help right away. Call 911. Do not wait to see if the symptoms will go away. Do not drive yourself to the hospital. Summary Hypertension is when the force of blood pumping through your arteries is too strong. If this condition is not controlled, it may put you at risk for serious complications. Your personal target blood pressure may vary depending on your medical conditions, your age, and other factors. For most people, a normal blood pressure is less than 120/80. Hypertension is treated with lifestyle changes, medicines, or a combination of both. Lifestyle changes include losing weight, eating a healthy,   low-sodium diet, exercising more, and limiting alcohol. This information is not intended to replace advice given to you by your health care provider. Make sure you discuss any questions you have with your health care provider. Document Revised: 11/24/2020 Document Reviewed: 11/24/2020 Elsevier Patient Education  2023 [employer] Inc.  

## 2021-10-15 LAB — BMP8+EGFR
BUN/Creatinine Ratio: 18 (ref 9–23)
BUN: 17 mg/dL (ref 6–24)
CO2: 25 mmol/L (ref 20–29)
Calcium: 9.5 mg/dL (ref 8.7–10.2)
Chloride: 102 mmol/L (ref 96–106)
Creatinine, Ser: 0.94 mg/dL (ref 0.57–1.00)
Glucose: 76 mg/dL (ref 70–99)
Potassium: 4.1 mmol/L (ref 3.5–5.2)
Sodium: 142 mmol/L (ref 134–144)
eGFR: 71 mL/min/{1.73_m2} (ref >59)

## 2021-10-20 DIAGNOSIS — G4733 Obstructive sleep apnea (adult) (pediatric): Secondary | ICD-10-CM | POA: Diagnosis not present

## 2021-10-22 ENCOUNTER — Encounter: Payer: Self-pay | Admitting: Nurse Practitioner

## 2021-11-19 DIAGNOSIS — G4733 Obstructive sleep apnea (adult) (pediatric): Secondary | ICD-10-CM | POA: Diagnosis not present

## 2021-12-20 DIAGNOSIS — G4733 Obstructive sleep apnea (adult) (pediatric): Secondary | ICD-10-CM | POA: Diagnosis not present

## 2022-03-10 ENCOUNTER — Encounter: Payer: Medicaid Other | Admitting: Nurse Practitioner

## 2022-04-09 ENCOUNTER — Other Ambulatory Visit: Payer: Self-pay | Admitting: Nurse Practitioner

## 2022-04-09 DIAGNOSIS — I10 Essential (primary) hypertension: Secondary | ICD-10-CM

## 2022-04-18 DIAGNOSIS — G4733 Obstructive sleep apnea (adult) (pediatric): Secondary | ICD-10-CM | POA: Diagnosis not present

## 2022-04-21 ENCOUNTER — Other Ambulatory Visit: Payer: Self-pay | Admitting: Nurse Practitioner

## 2022-04-21 DIAGNOSIS — Z1231 Encounter for screening mammogram for malignant neoplasm of breast: Secondary | ICD-10-CM

## 2022-04-27 ENCOUNTER — Ambulatory Visit (INDEPENDENT_AMBULATORY_CARE_PROVIDER_SITE_OTHER): Payer: Medicaid Other | Admitting: Nurse Practitioner

## 2022-04-27 ENCOUNTER — Encounter: Payer: Self-pay | Admitting: Nurse Practitioner

## 2022-04-27 VITALS — BP 128/82 | HR 87 | Temp 98.4°F | Ht 65.0 in | Wt 210.0 lb

## 2022-04-27 DIAGNOSIS — I10 Essential (primary) hypertension: Secondary | ICD-10-CM

## 2022-04-27 DIAGNOSIS — R110 Nausea: Secondary | ICD-10-CM

## 2022-04-27 DIAGNOSIS — Z0000 Encounter for general adult medical examination without abnormal findings: Secondary | ICD-10-CM | POA: Diagnosis not present

## 2022-04-27 DIAGNOSIS — R0683 Snoring: Secondary | ICD-10-CM | POA: Insufficient documentation

## 2022-04-27 DIAGNOSIS — R7309 Other abnormal glucose: Secondary | ICD-10-CM

## 2022-04-27 DIAGNOSIS — Z1321 Encounter for screening for nutritional disorder: Secondary | ICD-10-CM | POA: Diagnosis not present

## 2022-04-27 DIAGNOSIS — G4733 Obstructive sleep apnea (adult) (pediatric): Secondary | ICD-10-CM

## 2022-04-27 DIAGNOSIS — E7800 Pure hypercholesterolemia, unspecified: Secondary | ICD-10-CM

## 2022-04-27 MED ORDER — ONDANSETRON HCL 4 MG PO TABS: 4.0000 mg | ORAL_TABLET | Freq: Every day | ORAL | 1 refills | Status: AC | PRN

## 2022-04-27 NOTE — Progress Notes (Signed)
I,Sheena H Holbrook,acting as a Neurosurgeon for Makayla Felts, FNP.,have documented all relevant documentation on the behalf of Makayla Felts, FNP,as directed by  Makayla Felts, FNP while in the presence of Makayla Felts, FNP.   Subjective:     Patient ID: Makayla Aguilar , female    DOB: February 06, 1965 , 57 y.o.   MRN: [id number]   Chief Complaint  Patient presents with   Annual Exam    HPI  Patient presents today for annual exam. Patient declines COVID and Shingles vaccines at this time.   She has seen Dr. Elnoria Howard for her heart burn last year - did not find any issues.   She has taken her blood pressure medications this morning at 5a. She is sleeping and using her CPAP machine.      Past Medical History:  Diagnosis Date   Hypertension    OSA on CPAP    Sleep apnea      Family History  Problem Relation Age of Onset   Alcohol abuse Mother    Diabetes Father    Breast cancer Neg Hx      Current Outpatient Medications:    Multiple Vitamin (MULTIVITAMIN PO), Take by mouth daily., Disp: , Rfl:    olmesartan (BENICAR) 40 MG tablet, TAKE 1 TABLET(40 MG) BY MOUTH DAILY, Disp: 90 tablet, Rfl: 1   ondansetron (ZOFRAN) 4 MG tablet, Take 1 tablet (4 mg total) by mouth daily as needed for nausea or vomiting., Disp: 30 tablet, Rfl: 1   Ubrogepant (UBRELVY) 50 MG TABS, Take 1 tablet by mouth daily., Disp: 30 tablet, Rfl: 2   Allergies  Allergen Reactions   Hydrocodone     Gets really high      The patient states she is status post hysterectomy.  No LMP recorded. Patient has had a hysterectomy.  Negative for Dysmenorrhea and Negative for Menorrhagia. Negative for: breast discharge, breast lump(s), breast pain and breast self exam. Associated symptoms include abnormal vaginal bleeding. Pertinent negatives include abnormal bleeding (hematology), anxiety, decreased libido, depression, difficulty falling sleep, dyspareunia, history of infertility, nocturia, sexual dysfunction, sleep disturbances,  urinary incontinence, urinary urgency, vaginal discharge and vaginal itching. Diet regular but having problems with nausea; she is eating fruits. The patient states her exercise level is moderate with 5 days a week with walking.   The patient's tobacco use is:  Social History   Tobacco Use  Smoking Status Every Day   Packs/day: .5   Types: Cigarettes   Last attempt to quit: 02/01/2019   Years since quitting: 3.2  Smokeless Tobacco Never  Tobacco Comments   She has started back smoking about 2 cigarettes a day, 9/14 now smoking 5-6 cigarettes a day, less than 5 cigarettes a day   She has been exposed to passive smoke. The patient's alcohol use is:  Social History   Substance and Sexual Activity  Alcohol Use Not Currently   Review of Systems  Constitutional:  Positive for appetite change.  HENT: Negative.    Eyes: Negative.   Respiratory: Negative.    Cardiovascular: Negative.   Gastrointestinal:  Positive for nausea. Negative for constipation and diarrhea.  Endocrine: Negative.   Genitourinary: Negative.   Musculoskeletal: Negative.        Right foot pain - 1 month ago   Skin: Negative.   Allergic/Immunologic: Negative.   Neurological: Negative.  Negative for dizziness and headaches.  Hematological: Negative.   Psychiatric/Behavioral: Negative.       Today's Vitals   04/27/22 1156  04/27/22 1250  BP: (!) 148/92 128/82  Pulse: 87   Temp: 98.4 F (36.9 C)   TempSrc: Oral   SpO2: 97%   Weight: 210 lb (95.3 kg)   Height: 5\' 5"  (1.651 m)    Body mass index is 34.95 kg/m.   Objective:  Physical Exam Vitals reviewed.  Constitutional:      General: She is not in acute distress.    Appearance: Normal appearance. She is well-developed. She is obese.  HENT:     Head: Normocephalic and atraumatic.     Right Ear: Hearing, tympanic membrane, ear canal and external ear normal. There is no impacted cerumen.     Left Ear: Hearing, tympanic membrane, ear canal and external ear  normal. There is no impacted cerumen.     Nose: Nose normal.     Mouth/Throat:     Mouth: Mucous membranes are moist.  Eyes:     General: Lids are normal.     Extraocular Movements: Extraocular movements intact.     Conjunctiva/sclera: Conjunctivae normal.     Pupils: Pupils are equal, round, and reactive to light.     Funduscopic exam:    Right eye: No papilledema.        Left eye: No papilledema.  Neck:     Thyroid: No thyroid mass.     Vascular: No carotid bruit.  Cardiovascular:     Rate and Rhythm: Normal rate and regular rhythm.     Pulses: Normal pulses.     Heart sounds: Normal heart sounds. No murmur heard. Pulmonary:     Effort: Pulmonary effort is normal. No respiratory distress.     Breath sounds: Normal breath sounds. No wheezing.  Chest:     Chest wall: No mass.  Breasts:    Tanner Score is 5.     Right: Normal. No mass or tenderness.     Left: Normal. No mass or tenderness.  Abdominal:     General: Abdomen is flat. Bowel sounds are normal. There is no distension.     Palpations: Abdomen is soft.     Tenderness: There is no abdominal tenderness.  Genitourinary:    Rectum: Guaiac result negative.  Musculoskeletal:        General: No swelling. Normal range of motion.     Cervical back: Full passive range of motion without pain, normal range of motion and neck supple.     Right lower leg: No edema.     Left lower leg: No edema.  Lymphadenopathy:     Upper Body:     Right upper body: No supraclavicular, axillary or pectoral adenopathy.     Left upper body: No supraclavicular, axillary or pectoral adenopathy.  Skin:    General: Skin is warm and dry.     Capillary Refill: Capillary refill takes less than 2 seconds.  Neurological:     General: No focal deficit present.     Mental Status: She is alert and oriented to person, place, and time.     Cranial Nerves: No cranial nerve deficit.     Sensory: No sensory deficit.  Psychiatric:        Mood and Affect:  Mood normal.        Behavior: Behavior normal.        Thought Content: Thought content normal.        Judgment: Judgment normal.         Assessment And Plan:     1. Health maintenance examination Behavior  modifications discussed and diet history reviewed.   Pt will continue to exercise regularly and modify diet with low GI, plant based foods and decrease intake of processed foods.  Recommend intake of daily multivitamin, Vitamin D, and calcium.  Recommend mammogram and colonoscopy for preventive screenings, as well as recommend immunizations that include influenza, TDAP, and Shingles . 2. Encounter for vitamin deficiency screening - VITAMIN D 25 Hydroxy (Vit-D Deficiency, Fractures)  3. Primary hypertension Comments: Blood pressure is fairly controlled.  Continue current medications.  Encouraged to focus on lifestyle modifications. EKG done normal sinus rhythm HR 78  - EKG 12-Lead - CBC - CMP14+EGFR - Microalbumin / Creatinine Urine Ratio  4. Elevated cholesterol Comments: Cholesterol levels are stable.  No current medications.  Encouraged to eat a low-fat diet and increase fiber intake. - Lipid panel  5. Elevated hemoglobin A1c Comments: No current medications at this time.  Hemoglobin A1c has been slightly elevated.  Is on diet low in carbs and sugar - Hemoglobin A1c  6. Obstructive sleep apnea treated with continuous positive airway pressure (CPAP) Comments: Doing well with her CPAP and feels like it is beneficial.  7. Nausea Comments: He has had ongoing nausea will check lab labs for gallbladder issues.  Will also check abdomen x-ray - DG Abd 1 View; Future - Lipase - Amylase - ondansetron (ZOFRAN) 4 MG tablet; Take 1 tablet (4 mg total) by mouth daily as needed for nausea or vomiting.  Dispense: 30 tablet; Refill: 1   Patient was given opportunity to ask questions. Patient verbalized understanding of the plan and was able to repeat key elements of the plan. All  questions were answered to their satisfaction.   Makayla FeltsJanece Keyonna Comunale, FNP   I, Makayla FeltsJanece Shanya Ferriss, FNP, have reviewed all documentation for this visit. The documentation on 04/27/22 for the exam, diagnosis, procedures, and orders are all accurate and complete.   THE PATIENT IS ENCOURAGED TO PRACTICE SOCIAL DISTANCING DUE TO THE COVID-19 PANDEMIC.

## 2022-04-27 NOTE — Patient Instructions (Addendum)
You have an appt with Mobile Breast Exam here at [facility] on April 29th at [place] to [place] on [address] you can walk in any time no appt needed

## 2022-04-28 LAB — CBC
Hematocrit: 41.4 % (ref 34.0–46.6)
Hemoglobin: 13.7 g/dL (ref 11.1–15.9)
MCH: 30.2 pg (ref 26.6–33.0)
MCHC: 33.1 g/dL (ref 31.5–35.7)
MCV: 91 fL (ref 79–97)
Platelets: 407 10*3/uL (ref 150–450)
RBC: 4.54 x10E6/uL (ref 3.77–5.28)
RDW: 13.1 % (ref 11.7–15.4)
WBC: 10.7 10*3/uL (ref 3.4–10.8)

## 2022-04-28 LAB — CMP14+EGFR
ALT: 13 IU/L (ref 0–32)
AST: 14 IU/L (ref 0–40)
Albumin/Globulin Ratio: 1.5 (ref 1.2–2.2)
Albumin: 4.3 g/dL (ref 3.8–4.9)
Alkaline Phosphatase: 85 IU/L (ref 44–121)
BUN/Creatinine Ratio: 13 (ref 9–23)
BUN: 13 mg/dL (ref 6–24)
Bilirubin Total: 0.3 mg/dL (ref 0.0–1.2)
CO2: 24 mmol/L (ref 20–29)
Calcium: 9.9 mg/dL (ref 8.7–10.2)
Chloride: 102 mmol/L (ref 96–106)
Creatinine, Ser: 1.01 mg/dL — ABNORMAL HIGH (ref 0.57–1.00)
Globulin, Total: 2.8 g/dL (ref 1.5–4.5)
Glucose: 82 mg/dL (ref 70–99)
Potassium: 4.3 mmol/L (ref 3.5–5.2)
Sodium: 141 mmol/L (ref 134–144)
Total Protein: 7.1 g/dL (ref 6.0–8.5)
eGFR: 65 mL/min/{1.73_m2} (ref >59)

## 2022-04-28 LAB — LIPID PANEL
Chol/HDL Ratio: 3.6 ratio (ref 0.0–4.4)
Cholesterol, Total: 203 mg/dL — ABNORMAL HIGH (ref 100–199)
HDL: 57 mg/dL (ref >39)
LDL Chol Calc (NIH): 124 mg/dL — ABNORMAL HIGH (ref 0–99)
Triglycerides: 123 mg/dL (ref 0–149)
VLDL Cholesterol Cal: 22 mg/dL (ref 5–40)

## 2022-04-28 LAB — VITAMIN D 25 HYDROXY (VIT D DEFICIENCY, FRACTURES): Vit D, 25-Hydroxy: 38.1 ng/mL (ref 30.0–100.0)

## 2022-04-28 LAB — HEMOGLOBIN A1C
Est. average glucose Bld gHb Est-mCnc: 108 mg/dL
Hgb A1c MFr Bld: 5.4 % (ref 4.8–5.6)

## 2022-04-28 LAB — MICROALBUMIN / CREATININE URINE RATIO
Creatinine, Urine: 377.1 mg/dL
Microalb/Creat Ratio: 35 mg/g creat — ABNORMAL HIGH (ref 0–29)
Microalbumin, Urine: 130.6 ug/mL

## 2022-04-28 LAB — LIPASE: Lipase: 32 U/L (ref 14–72)

## 2022-04-28 LAB — AMYLASE: Amylase: 75 U/L (ref 31–110)

## 2022-05-09 ENCOUNTER — Encounter: Payer: Self-pay | Admitting: Nurse Practitioner

## 2022-05-18 ENCOUNTER — Ambulatory Visit
Admission: RE | Admit: 2022-05-18 | Discharge: 2022-05-18 | Disposition: A | Discharge status: Home / Self Care | Payer: Medicaid Other | Source: Ambulatory Visit | Attending: Nurse Practitioner | Admitting: Nurse Practitioner

## 2022-05-18 DIAGNOSIS — R110 Nausea: Secondary | ICD-10-CM

## 2022-05-28 ENCOUNTER — Encounter: Payer: Self-pay | Admitting: Nurse Practitioner

## 2022-06-01 ENCOUNTER — Other Ambulatory Visit: Payer: Self-pay | Admitting: Nurse Practitioner

## 2022-06-01 MED ORDER — OMEPRAZOLE 20 MG PO CPDR: 20.0000 mg | DELAYED_RELEASE_CAPSULE | Freq: Every day | ORAL | 1 refills | Status: DC

## 2022-06-02 ENCOUNTER — Other Ambulatory Visit: Payer: Self-pay | Admitting: Nurse Practitioner

## 2022-06-20 ENCOUNTER — Telehealth: Payer: Self-pay | Admitting: Nurse Practitioner

## 2022-06-20 NOTE — Telephone Encounter (Signed)
Called patient to discuss most recent message in Mychart, left voicemail to return call to office.

## 2022-10-19 ENCOUNTER — Encounter: Payer: Self-pay | Admitting: Nurse Practitioner

## 2022-11-25 ENCOUNTER — Ambulatory Visit
Admission: RE | Admit: 2022-11-25 | Discharge: 2022-11-25 | Disposition: A | Discharge status: Home / Self Care | Payer: Medicaid Other | Source: Ambulatory Visit | Attending: Family Medicine | Admitting: Family Medicine

## 2022-11-25 ENCOUNTER — Other Ambulatory Visit: Payer: Self-pay | Admitting: Family Medicine

## 2022-11-25 DIAGNOSIS — M79671 Pain in right foot: Secondary | ICD-10-CM

## 2023-05-02 ENCOUNTER — Encounter: Payer: Medicaid Other | Admitting: Nurse Practitioner

## 2023-08-22 ENCOUNTER — Other Ambulatory Visit: Payer: Self-pay | Admitting: Family Medicine

## 2023-08-22 DIAGNOSIS — Z1231 Encounter for screening mammogram for malignant neoplasm of breast: Secondary | ICD-10-CM

## 2024-02-19 ENCOUNTER — Telehealth: Payer: Self-pay | Admitting: Neurology

## 2024-02-19 NOTE — Telephone Encounter (Signed)
 Received sleep referral from Dr. Madelin Brought at [facility] for OSA. Placed in sleep referrals box

## 2024-03-25 IMAGING — CT CT HEART MORP W/ CTA COR W/ SCORE W/ CA W/CM &/OR W/O CM
1 of 10 series · 3 of 20 positions shown, 4 images · IV contrast (APPLIED)
Comparison: Chest CT 02/17/2018

Addendum:
CLINICAL DATA: Chest pain

EXAM:
Cardiac/Coronary CTA
TECHNIQUE: A non-contrast, gated CT scan was obtained with axial slices of 3 mm
through the heart for calcium scoring. Calcium scoring was performed
using the Agatston method. A 110 kV retrospective, gated, contrast
cardiac scan was obtained. Gantry rotation speed was 250 msecs and
collimation was 0.6 mm. Two sublingual nitroglycerin tablets (0.8
mg) were given. The 3D data set was reconstructed in 5% intervals of
the 0-95% of the R-R cycle. Diastolic phases were analyzed on a
dedicated workstation using MPR, MIP, and VRT modes. The patient
received 95 cc of contrast.

[Series 12: ts syst · axial · 0.39mm/px · z∈[-261,-164]mm · 3 of 244 slices shown, 4 images]
[im 1/244  vessel]
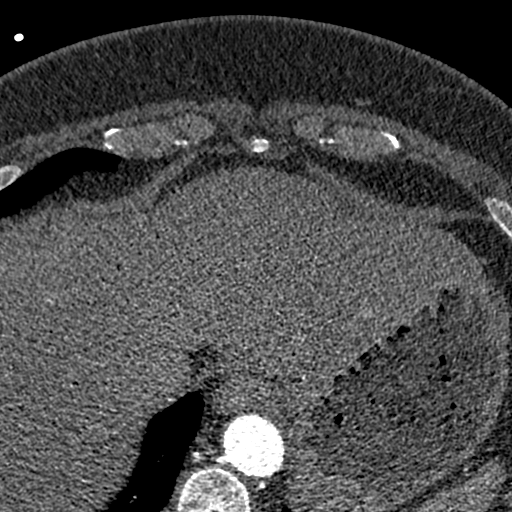
[im 1/244  lung]
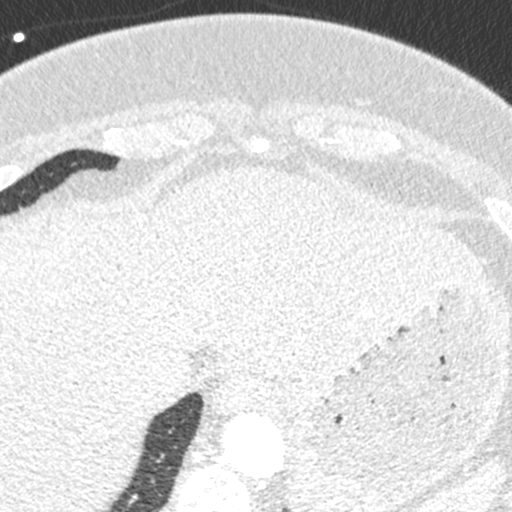
[im 122/244  vessel]
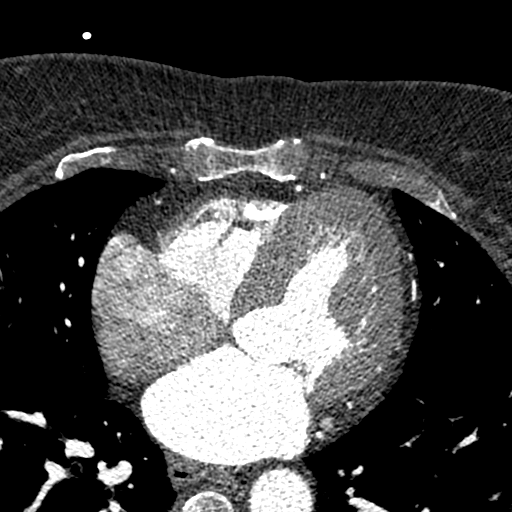
[im 244/244  vessel]
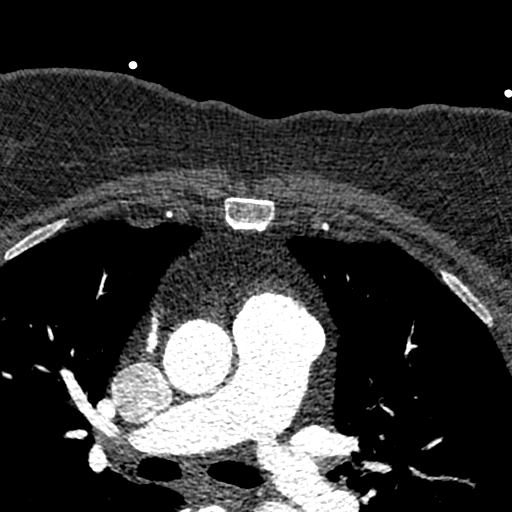

[3 of 20 positions shown; findings below may reference images not displayed]

FINDINGS: Image quality: Excellent.

Noise artifact is: Limited.

Coronary Arteries:  Normal coronary origin.  Left dominance.

Left main: The left main is a large caliber vessel with a normal
take off from the left coronary cusp that bifurcates to form a left
anterior descending artery and a left circumflex artery. There is no
plaque or stenosis.

Left anterior descending artery: The LAD is patent without evidence
of plaque or stenosis. The LAD supplies the distal [DATE] of the
posterior interventricular groove.

Left circumflex artery: The LCX is dominant and patent with no
evidence of plaque or stenosis. The LCX gives off 3 patent obtuse
marginal branches. The LCX terminates as a patent PDA.

Right coronary artery: The RCA is non-dominant with normal take off
from the right coronary cusp. There is no evidence of plaque or
stenosis.

Right [facility]: Right atrial size is within normal limits.

Right Ventricle: The right ventricular cavity is within normal
limits.

Left [facility]: Left atrial size is normal in size with no left atrial
appendage filling defect.

Left Ventricle: The ventricular cavity size is within normal limits.

Pulmonary arteries: Normal in size without proximal filling defect.

Pulmonary veins: Normal pulmonary venous drainage.

Pericardium: Normal thickness without significant effusion or
calcium present.

Cardiac valves: The aortic valve is trileaflet without significant
calcification. The mitral valve is normal without significant
calcification.

Aorta: Normal caliber without significant disease.

Extra-cardiac findings: See attached radiology report for
non-cardiac structures.
IMPRESSION: 1. Coronary calcium score of 0.

2. Normal coronary origin with right dominance.

3. Normal coronary arteries.

RECOMMENDATIONS:
1. No evidence of CAD (0%). Consider non-atherosclerotic causes of
chest pain.

EXAM:
OVER-READ INTERPRETATION  CT CHEST

The following report is an over-read performed by radiologist Dr.
does not include interpretation of cardiac or coronary anatomy or
pathology. The coronary CTA interpretation by the cardiologist is
attached.
FINDINGS: Vascular: No significant extracardiac vascular findings.

Mediastinum/Nodes: No lymphadenopathy.

Lungs/Pleura: No suspicious pulmonary nodules within the field of
view.

Upper Abdomen: No acute abnormality.

Musculoskeletal: No chest wall mass or suspicious bone lesions
identified within the field of view.
IMPRESSION: No acute or significant incidental extracardiac findings in the
chest.

*** End of Addendum ***
FINDINGS: Image quality: Excellent.

Noise artifact is: Limited.

Coronary Arteries:  Normal coronary origin.  Left dominance.

Left main: The left main is a large caliber vessel with a normal
take off from the left coronary cusp that bifurcates to form a left
anterior descending artery and a left circumflex artery. There is no
plaque or stenosis.

Left anterior descending artery: The LAD is patent without evidence
of plaque or stenosis. The LAD supplies the distal [DATE] of the
posterior interventricular groove.

Left circumflex artery: The LCX is dominant and patent with no
evidence of plaque or stenosis. The LCX gives off 3 patent obtuse
marginal branches. The LCX terminates as a patent PDA.

Right coronary artery: The RCA is non-dominant with normal take off
from the right coronary cusp. There is no evidence of plaque or
stenosis.

Right [facility]: Right atrial size is within normal limits.

Right Ventricle: The right ventricular cavity is within normal
limits.

Left [facility]: Left atrial size is normal in size with no left atrial
appendage filling defect.

Left Ventricle: The ventricular cavity size is within normal limits.

Pulmonary arteries: Normal in size without proximal filling defect.

Pulmonary veins: Normal pulmonary venous drainage.

Pericardium: Normal thickness without significant effusion or
calcium present.

Cardiac valves: The aortic valve is trileaflet without significant
calcification. The mitral valve is normal without significant
calcification.

Aorta: Normal caliber without significant disease.

Extra-cardiac findings: See attached radiology report for
non-cardiac structures.
IMPRESSION: 1. Coronary calcium score of 0.

2. Normal coronary origin with right dominance.

3. Normal coronary arteries.

RECOMMENDATIONS:
1. No evidence of CAD (0%). Consider non-atherosclerotic causes of
chest pain.

## 2024-03-31 IMAGING — US US ABDOMEN LIMITED
1 series · 14 of 25 positions shown · non-contrast
Comparison: None.

CLINICAL DATA: Right upper quadrant pain.

EXAM:
ULTRASOUND ABDOMEN LIMITED RIGHT UPPER QUADRANT

[Series 1: us abdomen limited · 0.20mm/px · 14 of 53 slices shown]
[im 1/53]
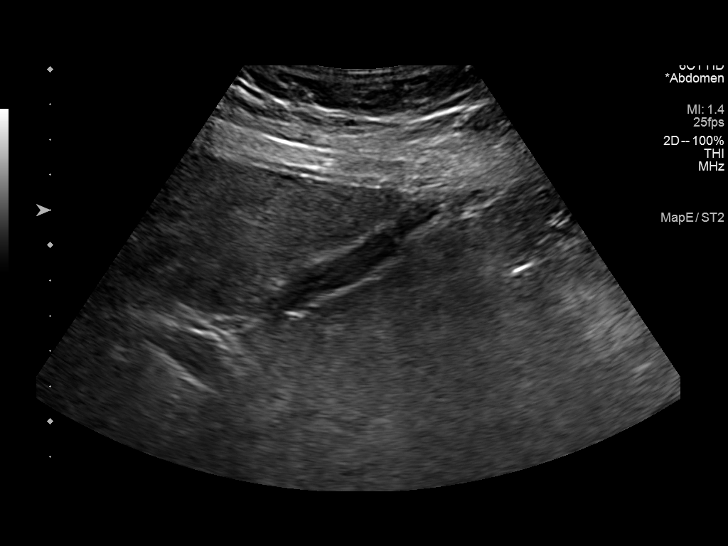
[im 5/53]
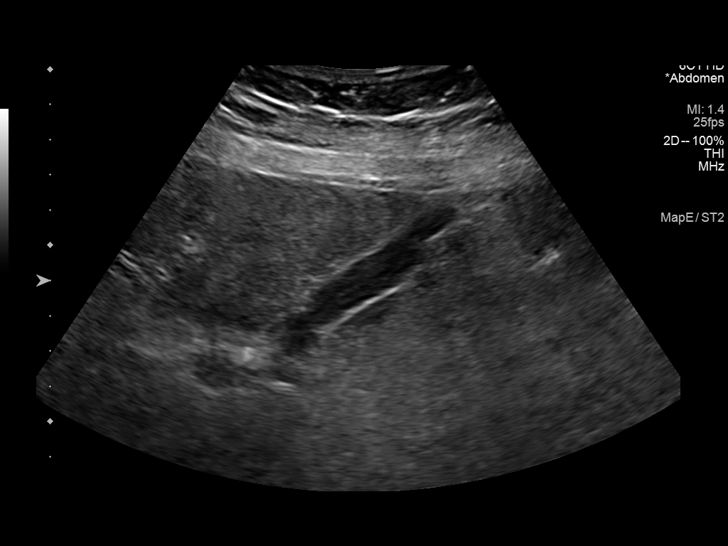
[im 9/53]
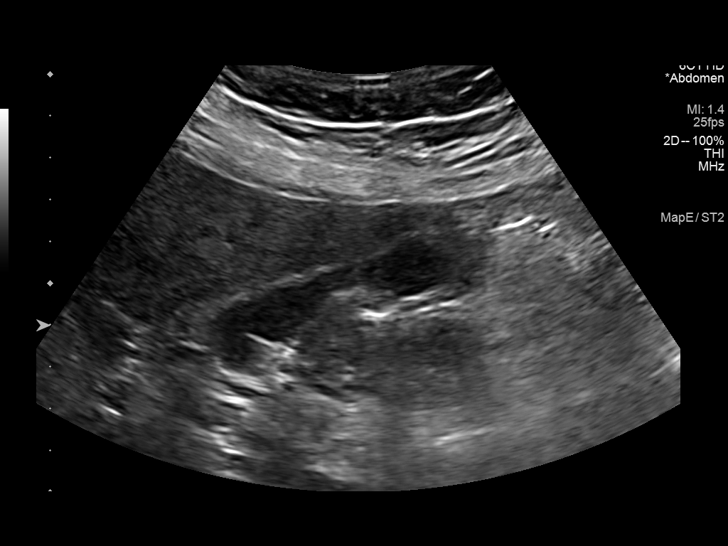
[im 14/53]
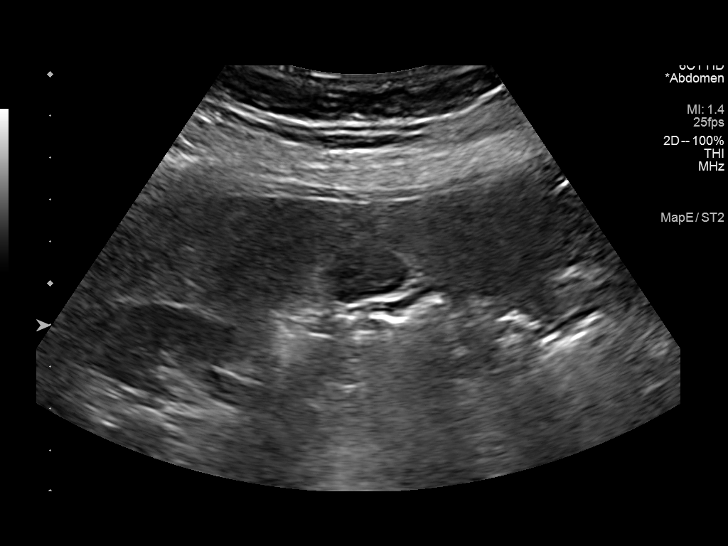
[im 18/53]
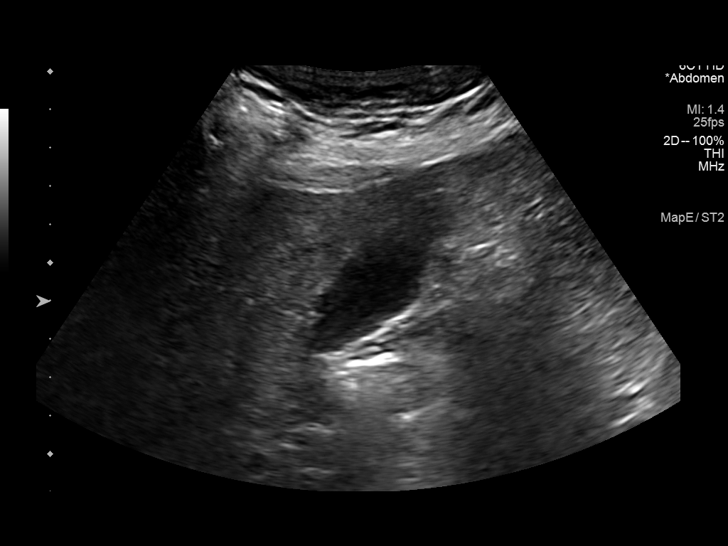
[im 20/53]
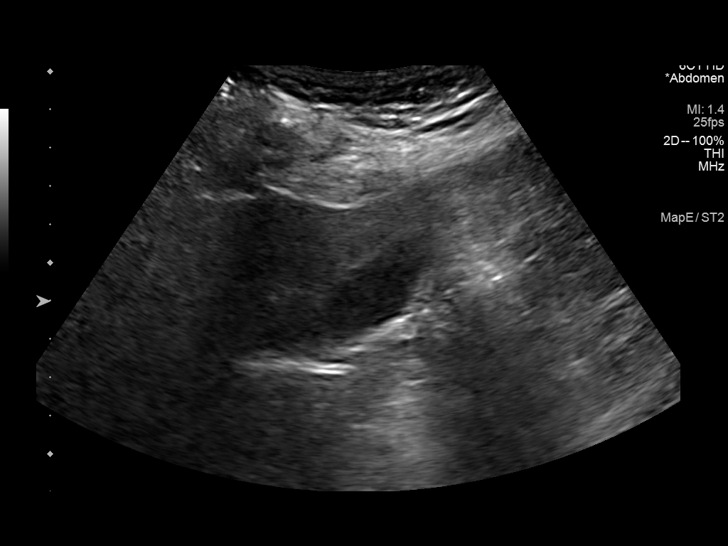
[im 24/53]
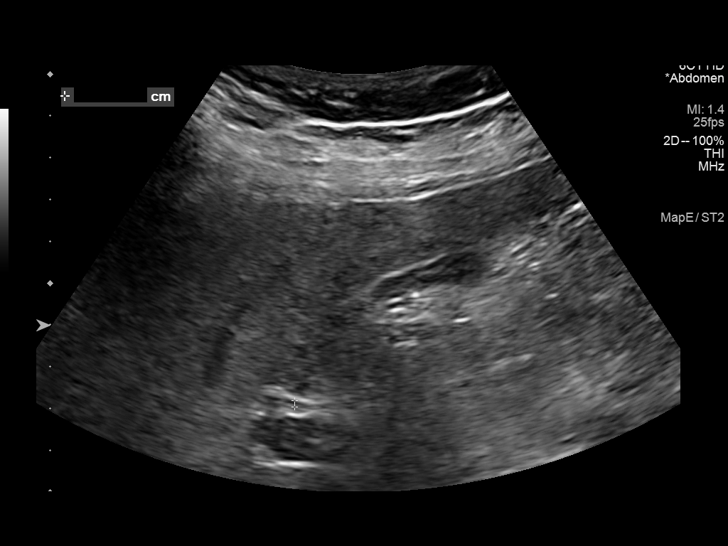
[im 29/53]
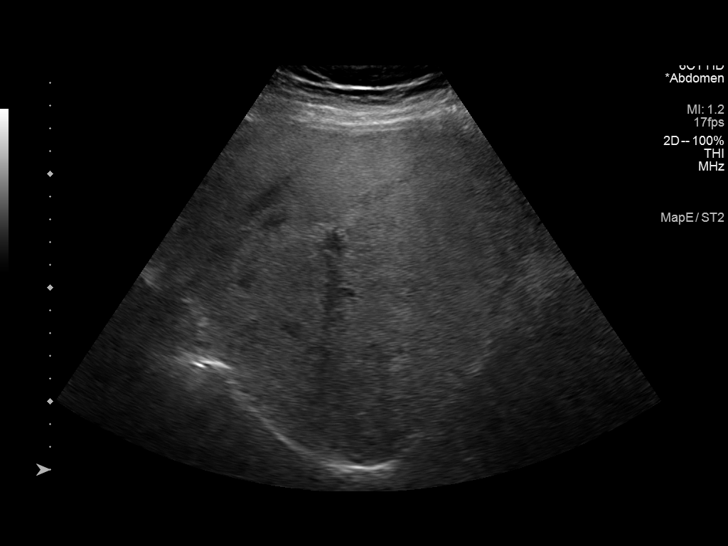
[im 33/53]
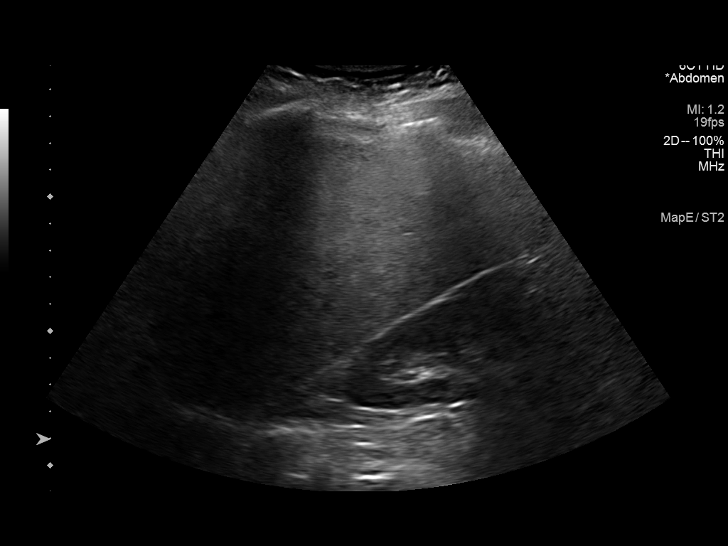
[im 35/53]
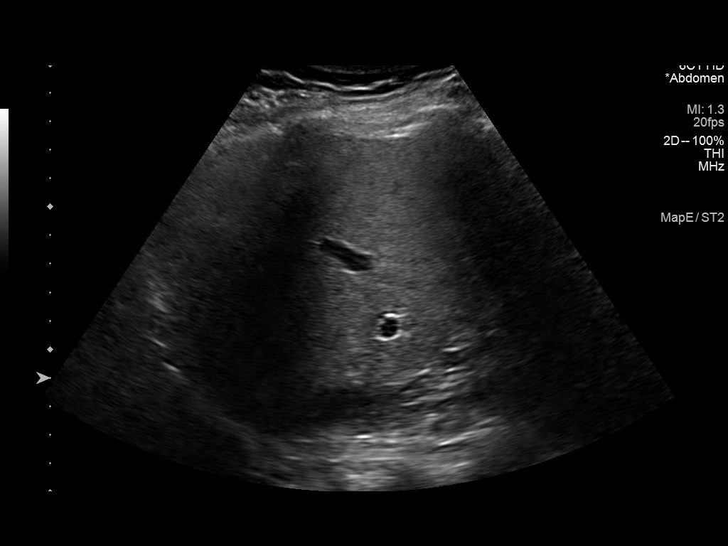
[im 40/53]
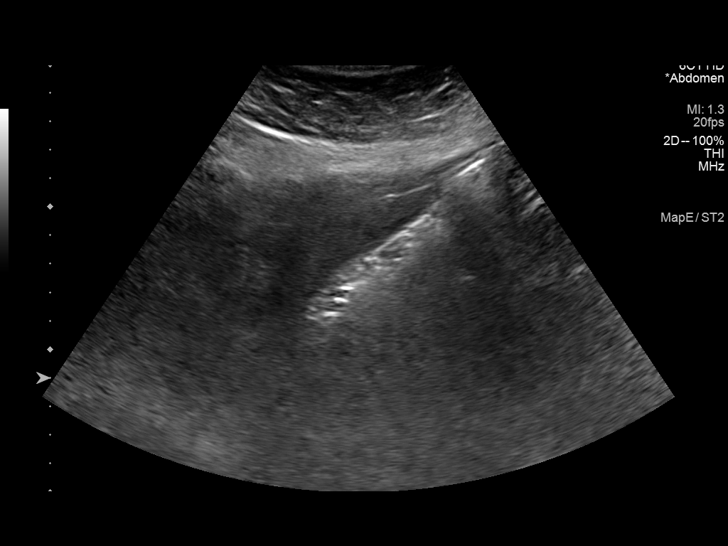
[im 44/53]
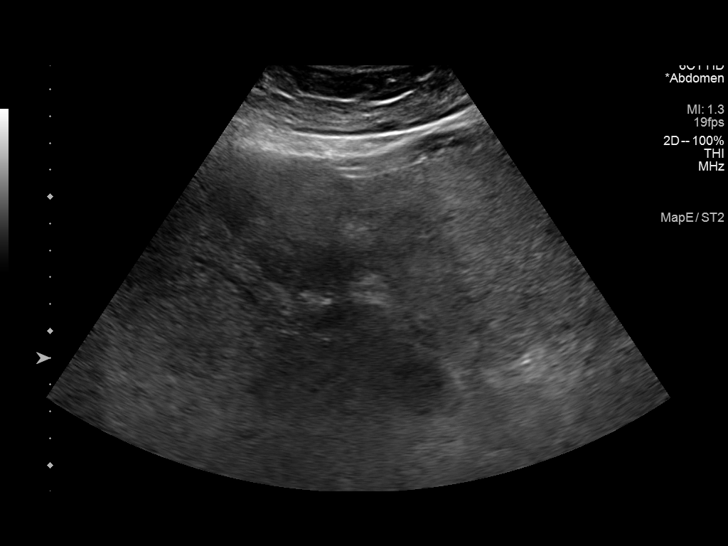
[im 48/53]
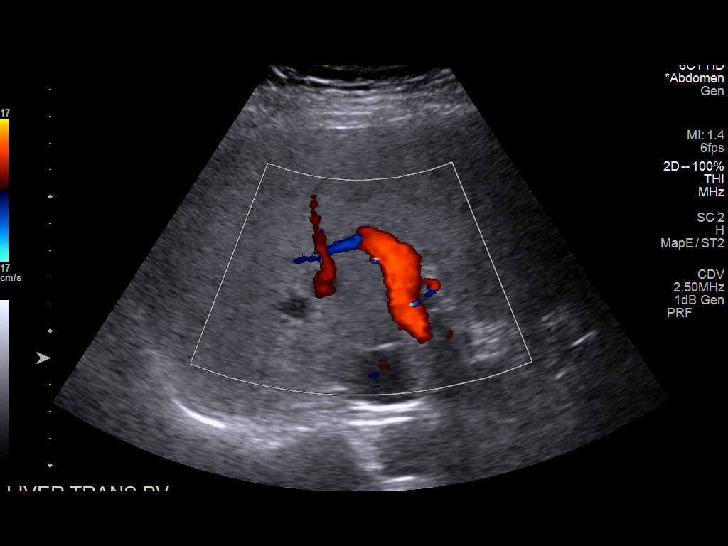
[im 53/53]
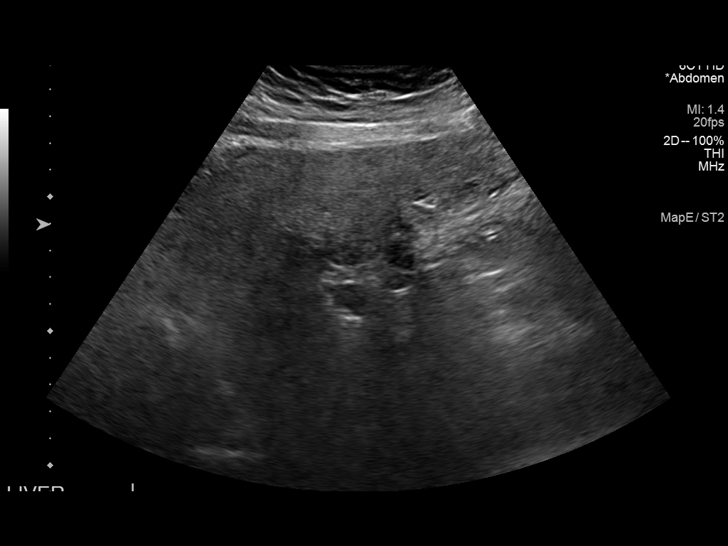

[14 of 25 positions shown; findings below may reference images not displayed]

FINDINGS: Gallbladder:

Limited evaluation due to lack of distention. No obvious wall
thickening. No Murphy's sign or stones identified. It would be
difficult to exclude sludge on today's study.

Common bile duct:

Diameter: 1.5 mm

Liver:

Diffuse increased echogenicity throughout the liver. No suspicious
masses. Portal vein is patent on color Doppler imaging with normal
direction of blood flow towards the liver.

Other: None.
IMPRESSION: 1. Evaluation of the gallbladder is limited due to lack of
distention. It would be difficult to exclude sludge. No other
abnormalities are identified on these limited views.
2. Probable hepatic steatosis.
3. No other abnormalities.
